# Patient Record
Sex: Male | Born: 1940 | Race: White | Hispanic: No | Smoking: Former smoker
Health system: Southern US, Community
[De-identification: ages and names within clinical notes are randomized; demographics above are authoritative.]

## PROBLEM LIST (undated history)

## (undated) DIAGNOSIS — C801 Malignant (primary) neoplasm, unspecified: Secondary | ICD-10-CM

---

## 2004-10-12 ENCOUNTER — Ambulatory Visit: Payer: Self-pay | Admitting: Internal Medicine

## 2005-01-17 ENCOUNTER — Ambulatory Visit: Payer: Self-pay | Admitting: Internal Medicine

## 2005-02-05 ENCOUNTER — Ambulatory Visit: Payer: Self-pay | Admitting: Gastroenterology

## 2005-02-10 ENCOUNTER — Observation Stay (HOSPITAL_COMMUNITY): Admission: EM | Admit: 2005-02-10 | Discharge: 2005-02-11 | Payer: Self-pay | Admitting: Emergency Medicine

## 2005-03-05 ENCOUNTER — Ambulatory Visit: Payer: Self-pay | Admitting: Gastroenterology

## 2005-03-13 ENCOUNTER — Ambulatory Visit: Payer: Self-pay | Admitting: Gastroenterology

## 2006-07-02 ENCOUNTER — Ambulatory Visit: Payer: Self-pay | Admitting: Internal Medicine

## 2007-01-19 ENCOUNTER — Encounter: Payer: Self-pay | Admitting: *Deleted

## 2008-08-15 ENCOUNTER — Emergency Department (HOSPITAL_COMMUNITY): Admission: EM | Admit: 2008-08-15 | Discharge: 2008-08-15 | Payer: Self-pay | Admitting: Emergency Medicine

## 2008-08-19 ENCOUNTER — Ambulatory Visit (HOSPITAL_COMMUNITY): Admission: RE | Admit: 2008-08-19 | Discharge: 2008-08-19 | Payer: Self-pay | Admitting: General Surgery

## 2010-09-13 LAB — BASIC METABOLIC PANEL
BUN: 9 mg/dL (ref 6–23)
Chloride: 99 mEq/L (ref 96–112)
GFR calc Af Amer: >60 mL/min (ref >60)
GFR calc non Af Amer: >60 mL/min (ref >60)
Glucose, Bld: 94 mg/dL (ref 70–99)
Potassium: 5 mEq/L (ref 3.5–5.1)

## 2010-09-13 LAB — CBC
HCT: 46.6 % (ref 39.0–52.0)
MCHC: 34.9 g/dL (ref 30.0–36.0)
RBC: 4.71 MIL/uL (ref 4.22–5.81)
RDW: 13.7 % (ref 11.5–15.5)
WBC: 5.3 10*3/uL (ref 4.0–10.5)

## 2010-10-16 NOTE — Op Note (Signed)
NAMEMarland Kitchen  Shane Campos, Shane Campos NO.:  [id number]   MEDICAL RECORD NO.:  [id number]          PATIENT TYPE:  AMB   LOCATION:  DAY                           FACILITY:  [facility]   PHYSICIAN:  Tilford Pillar, MD      DATE OF BIRTH:  [birth year]   DATE OF PROCEDURE:  08/19/2008  DATE OF DISCHARGE:                               OPERATIVE REPORT   PREOPERATIVE DIAGNOSIS:  Right inguinal hernia.   POSTOPERATIVE DIAGNOSIS:  Right inguinal hernia.   PROCEDURE:  Right inguinal herniorrhaphy with Proceed mesh.   SURGEON:  Tilford Pillar, MD   ANESTHESIA:  General endotracheal, local anesthetic with 5% Sensorcaine  plain.   SPECIMEN:  None.   ESTIMATED BLOOD LOSS:  Minimal.   INDICATIONS:  The patient is a 70 year old male, who presented to my  office with notable bulge in the right groin.  On evaluation, this was  evidenced to be a right inguinal hernia.  This was easily reducible.  The risk, benefits, and alternatives of herniorrhaphy were discussed at  length with the patient including but not limited to the risk of  bleeding, infection, paresthesias, chronic pain, ischemic orchitis,  testicular loss as well as the possibility of intraoperative cardiac and  pulmonary events.  The patient's questions and concerns were addressed  and the patient was consented for planned procedure.   OPERATION:  The patient was taken to the operating room, was placed in  supine position on the operating table, at which time general anesthetic  was administered.  Once the patient was asleep, he was endotracheally  intubated by Anesthesia.  At this point, the patient's abdomen was  prepped with DuraPrep solution and draped in standard fashion.  A  marking pen was utilized to mark planned line of incision and scalpel  was utilized to create the initial incision.  Dissection down through  subcu tissue including Scarpa fascia was carried out using  electrocautery.  This was carried out down to the  external oblique  fascia.  This was initially scored with a scalpel and then opened  towards the external inguinal ring with Metzenbaum scissors.  With this  opened the hernia and cord structures were freed from the inguinal canal  walls with blunt finger dissection.  A window was created underneath the  cord structures at the pubic tubercle with a blunt digital dissection.  A Penrose drain was then utilized to help to elevate the  cord  structures and hernia sac up to the field.  At this time, I carefully  dissected the indirect component of the hernia, freed from the cord  structures.  With this free, I twisted it upon itself and  performed the  high ligation with a 2-0 Vicryl suture.  The redundant sac was trimmed  with electrocautery and was discarded.  Sac was allowed to return back  to the peritoneal cavity.  Mesh plug was then placed into the defect and  secured with 2-0 Novofil sutures x2 to prevent the migration of the  plug.  Additionally, the patient did have a direct component of this  hernia.  This was  reduced and additional mesh plug was created out of  Prolene mesh with this in place.  This also was fixed with a 2-0 Novofil  suture and then the onlay was placed.  This was packed medially over the  pubic tubercle inferiorly to the shelving portion of the inguinal  ligament superiorly to the conjoint tendon.  A keyhole defect was  secured around the cord structures, then entered into the internal  inguinal ring and tails were tucked underneath the external oblique  fascia.  I was quite pleased with the appearance of the repair.  I then  irrigated the field and closed the fascial layers with a 2-0 Vicryl in a  running continuous fashion.  Local anesthetic was instilled.  A 3-0  Vicryl suture was utilized to reapproximate the  Scarpa fascia.  With  this reapproximated, the 4-0 Monocryl was utilized to reapproximate the  skin edges with running subcuticular suture.  The skin was  washed and  dried with moist dry towel.  Benzoin was applied around the incision.  Half-inch Steri-Strips placed.  Drapes were removed.  The patient was  allowed to come out of the general anesthetic and was transferred back  to regular hospital bed.  He was transferred to Post-Anesthetic Care  Unit in stable condition.  At the conclusion of procedure, all  instrument, sponge, and needle counts were correct.  The patient  tolerated the procedure well.      Tilford Pillar, MD  Electronically Signed     BZ/MEDQ  D:  08/19/2008  T:  08/20/2008  Job:  [id number]

## 2010-10-19 NOTE — Op Note (Signed)
NAMEMarland Kitchen  Shane Campos, Shane Campos NO.:  [id number]   MEDICAL RECORD NO.:  [id number]          PATIENT TYPE:  INP   LOCATION:  1828                         FACILITY:  [facility]   PHYSICIAN:  Beulah Gandy. Ashley Royalty, M.D. DATE OF BIRTH:  [birth year]   DATE OF PROCEDURE:  02/10/2005  DATE OF DISCHARGE:                                 OPERATIVE REPORT   ADMISSION DIAGNOSIS:  Retained lens material in the vitreous, left eye,  glaucoma, left eye, corneal edema, left eye.   PROCEDURES:  Pars plana vitrectomy left eye, anterior chamber washout, left  eye.  Posterior capsulectomy, left eye.  Retinal photocoagulation, left eye.   SURGEON:  Alan Mulder M.D.   ASSISTANT:  Bryan Lemma. Lundquist, P.A.   ANESTHESIA:  General.   DETAILS:  Usual prep and drape, inspection of previous wound showed some  gaping.  Therefore a 10-0 X suture was placed in the wound. No leakage was  seen before or after the suture was placed.  A 5 mm infusion port was placed  at 4 o'clock.  The lighted pick and a cutter were placed at 10 and 2 o'clock  respectively. The sterile glycerin was placed on the cornea and Provisc was  placed on the cornea. A straight shot of epinephrine was injected into the  into the anterior chamber to dilate the pupil the pars plana vitrectomy was  begun behind the pseudophakos.  The view was very dim and difficult. As the  vitrectomy proceeded posteriorly, large white fluffy material was  encountered along with clots of blood in the vitreous cavity. These were  carefully removed under low suction and rapid cutting. The vitrectomy was  carried down to the macular surface where some blood was vacuumed from the  macular surface. The vitrectomy is carried out to the far periphery where  scleral depression was used to gain access to the peripheral vitreous base.  This area was trimmed with the vitreous cutter and all lens remnants were  removed.  The Biom was moved out of place and attention was  carried to the 4  o'clock posterior chamber area. Scleral depression was used to push lens  remnants toward the center of the eye and toward the vitreous cutter where  the remnants were removed. The 12 o'clock hour area was inspected and  additional lens remnants were seen in this area.  Again scleral depression  was used to push these remnants toward the vitreous cutter and toward the  center of the eye. The remnants jumped away from the edges of the implant  and into the vitreous cutter.  The vitreous cavity was rinsed again.  The  endolaser was positioned in the eye and approximately 250 burns were placed  around the far periphery with a power of 1000 milliwatts, 1,000 microns each  and 0.1 seconds each. Anterior chamber washout was performed with a 27 gauge  long needle and an empty 3 mL syringe.  The fluid was withdrawn as the  needle was passed along the inferior anterior chamber angle.  All lens  material was removed from the anterior chamber.  The instruments  were  removed from the eye and 9-0 nylon was used to close the sclerotomy sites.  They were tested and found to be tight. The conjunctiva was closed at 12  o'clock with 7-0 chromic and at 4 o'clock with a wet-field cautery.  The  indirect ophthalmoscope laser was moved into place. Additional burns were  placed with 500 milliwatts power for a total of 769 burns.  Polymyxin and  gentamicin were irrigated into Tenon's space, Decadron 10 milligrams was  injected into the lower subconjunctival space. Marcaine was injected around  the globe for postop pain. Tobradex ophthalmic ointment was placed. Closing  pressure was 10 mm Barraquer tonometer.  All of the wounds were found to be  tight.  A patch and shield were placed. The patient is awakened and taken to  recovery in satisfactory condition.   COMPLICATIONS:  None.   DURATION:  One and a half hours.      Beulah Gandy. Ashley Royalty, M.D.  Electronically Signed     JDM/MEDQ  D:   02/10/2005  T:  02/11/2005  Job:  [id number]

## 2015-02-21 ENCOUNTER — Encounter (HOSPITAL_COMMUNITY): Payer: Self-pay | Admitting: Emergency Medicine

## 2015-02-21 ENCOUNTER — Emergency Department (HOSPITAL_COMMUNITY): Payer: Commercial Managed Care - HMO

## 2015-02-21 ENCOUNTER — Inpatient Hospital Stay (HOSPITAL_COMMUNITY)
Admission: EM | Admit: 2015-02-21 | Discharge: 2015-02-28 | DRG: 166 | Disposition: A | Discharge status: Home / Self Care | Payer: Commercial Managed Care - HMO | Attending: Internal Medicine | Admitting: Internal Medicine

## 2015-02-21 DIAGNOSIS — E440 Moderate protein-calorie malnutrition: Secondary | ICD-10-CM | POA: Diagnosis present

## 2015-02-21 DIAGNOSIS — C4490 Unspecified malignant neoplasm of skin, unspecified: Secondary | ICD-10-CM | POA: Diagnosis present

## 2015-02-21 DIAGNOSIS — Z85828 Personal history of other malignant neoplasm of skin: Secondary | ICD-10-CM

## 2015-02-21 DIAGNOSIS — E871 Hypo-osmolality and hyponatremia: Secondary | ICD-10-CM | POA: Diagnosis present

## 2015-02-21 DIAGNOSIS — K5900 Constipation, unspecified: Secondary | ICD-10-CM | POA: Diagnosis not present

## 2015-02-21 DIAGNOSIS — Z6820 Body mass index (BMI) 20.0-20.9, adult: Secondary | ICD-10-CM

## 2015-02-21 DIAGNOSIS — J15211 Pneumonia due to Methicillin susceptible Staphylococcus aureus: Secondary | ICD-10-CM | POA: Diagnosis present

## 2015-02-21 DIAGNOSIS — R0600 Dyspnea, unspecified: Secondary | ICD-10-CM

## 2015-02-21 DIAGNOSIS — J189 Pneumonia, unspecified organism: Secondary | ICD-10-CM | POA: Diagnosis present

## 2015-02-21 DIAGNOSIS — R042 Hemoptysis: Secondary | ICD-10-CM | POA: Diagnosis present

## 2015-02-21 DIAGNOSIS — D649 Anemia, unspecified: Secondary | ICD-10-CM | POA: Diagnosis present

## 2015-02-21 DIAGNOSIS — C3490 Malignant neoplasm of unspecified part of unspecified bronchus or lung: Secondary | ICD-10-CM | POA: Diagnosis present

## 2015-02-21 DIAGNOSIS — C4499 Other specified malignant neoplasm of skin, unspecified: Secondary | ICD-10-CM | POA: Diagnosis present

## 2015-02-21 DIAGNOSIS — R066 Hiccough: Secondary | ICD-10-CM | POA: Diagnosis present

## 2015-02-21 DIAGNOSIS — R64 Cachexia: Secondary | ICD-10-CM | POA: Diagnosis present

## 2015-02-21 DIAGNOSIS — C3412 Malignant neoplasm of upper lobe, left bronchus or lung: Secondary | ICD-10-CM | POA: Diagnosis not present

## 2015-02-21 DIAGNOSIS — J181 Lobar pneumonia, unspecified organism: Secondary | ICD-10-CM

## 2015-02-21 DIAGNOSIS — R0602 Shortness of breath: Secondary | ICD-10-CM | POA: Diagnosis not present

## 2015-02-21 DIAGNOSIS — J449 Chronic obstructive pulmonary disease, unspecified: Secondary | ICD-10-CM | POA: Diagnosis present

## 2015-02-21 DIAGNOSIS — D638 Anemia in other chronic diseases classified elsewhere: Secondary | ICD-10-CM | POA: Diagnosis present

## 2015-02-21 DIAGNOSIS — J9601 Acute respiratory failure with hypoxia: Secondary | ICD-10-CM | POA: Diagnosis present

## 2015-02-21 DIAGNOSIS — E222 Syndrome of inappropriate secretion of antidiuretic hormone: Secondary | ICD-10-CM | POA: Diagnosis present

## 2015-02-21 DIAGNOSIS — F17200 Nicotine dependence, unspecified, uncomplicated: Secondary | ICD-10-CM | POA: Diagnosis present

## 2015-02-21 DIAGNOSIS — J984 Other disorders of lung: Secondary | ICD-10-CM | POA: Diagnosis present

## 2015-02-21 DIAGNOSIS — Z87891 Personal history of nicotine dependence: Secondary | ICD-10-CM

## 2015-02-21 DIAGNOSIS — I4891 Unspecified atrial fibrillation: Secondary | ICD-10-CM | POA: Diagnosis present

## 2015-02-21 HISTORY — DX: Malignant (primary) neoplasm, unspecified: C80.1

## 2015-02-21 LAB — BASIC METABOLIC PANEL
Anion gap: 9 (ref 5–15)
BUN: 15 mg/dL (ref 6–20)
CALCIUM: 8.3 mg/dL — AB (ref 8.9–10.3)
CO2: 25 mmol/L (ref 22–32)
CREATININE: 0.95 mg/dL (ref 0.61–1.24)
Chloride: 90 mmol/L — ABNORMAL LOW (ref 101–111)
GFR calc non Af Amer: >60 mL/min (ref >60)
Glucose, Bld: 120 mg/dL — ABNORMAL HIGH (ref 65–99)
Potassium: 3.9 mmol/L (ref 3.5–5.1)
SODIUM: 124 mmol/L — AB (ref 135–145)

## 2015-02-21 LAB — I-STAT TROPONIN, ED: TROPONIN I, POC: 0.02 ng/mL (ref 0.00–0.08)

## 2015-02-21 LAB — CBC
HCT: 31.2 % — ABNORMAL LOW (ref 39.0–52.0)
Hemoglobin: 10.5 g/dL — ABNORMAL LOW (ref 13.0–17.0)
MCH: 30.3 pg (ref 26.0–34.0)
MCHC: 33.7 g/dL (ref 30.0–36.0)
MCV: 89.9 fL (ref 78.0–100.0)
PLATELETS: 312 10*3/uL (ref 150–400)
RBC: 3.47 MIL/uL — AB (ref 4.22–5.81)
RDW: 13.7 % (ref 11.5–15.5)
WBC: 8.5 10*3/uL (ref 4.0–10.5)

## 2015-02-21 MED ORDER — IOHEXOL 350 MG/ML SOLN
100.0000 mL | Freq: Once | INTRAVENOUS | Status: AC | PRN
  Administered 2015-02-21: 70 mL via INTRAVENOUS

## 2015-02-21 NOTE — ED Provider Notes (Signed)
CSN: [id number]     Arrival date & time 02/21/15  2156 History   First MD Initiated Contact with Patient 02/21/15 2219     Chief Complaint  Patient presents with  . Shortness of Breath     (Consider location/radiation/quality/duration/timing/severity/associated sxs/prior Treatment) Patient is a 74 y.o. male presenting with shortness of breath.  Shortness of Breath Severity:  Moderate Onset quality:  Gradual Duration:  2 weeks Timing:  Constant Progression:  Worsening Chronicity:  New Context comment:  Spontaneous Relieved by:  Nothing Worsened by:  Nothing tried Associated symptoms: cough (with hiccups)   Associated symptoms: no abdominal pain, no fever and no vomiting     History reviewed. No pertinent past medical history. History reviewed. No pertinent past surgical history. No family history on file. Social History  Substance Use Topics  . Smoking status: Former Research scientist (life sciences)  . Smokeless tobacco: None  . Alcohol Use: No    Review of Systems  Constitutional: Negative for fever.  Respiratory: Positive for cough (with hiccups) and shortness of breath.   Gastrointestinal: Negative for vomiting and abdominal pain.  All other systems reviewed and are negative.     Allergies  Review of patient's allergies indicates no known allergies.  Home Medications   Prior to Admission medications   Medication Sig Start Date End Date Taking? Authorizing Provider  Pseudoephedrine-Ibuprofen (ADVIL COLD & SINUS LIQUI-GELS) 30-200 MG CAPS Take 1 tablet by mouth daily as needed.   Yes Historical Provider, MD   BP 101/59 mmHg  Pulse 94  Temp(Src) 98.3 F (36.8 C) (Oral)  Resp 23  Ht 6' (1.829 m)  Wt 147 lb 11.3 oz (67 kg)  BMI 20.03 kg/m2  SpO2 95% Physical Exam  Constitutional: He is oriented to person, place, and time. He appears well-developed and well-nourished.  HENT:  Head: Normocephalic and atraumatic.  Eyes: Conjunctivae and EOM are normal.  Neck: Normal range of  motion. Neck supple.  Cardiovascular: Normal heart sounds.  An irregularly irregular rhythm present. Tachycardia present.   Pulmonary/Chest: Effort normal and breath sounds normal. No respiratory distress.  Abdominal: He exhibits no distension. There is no tenderness. There is no rebound and no guarding.  Musculoskeletal: Normal range of motion.  Neurological: He is alert and oriented to person, place, and time.  Skin: Skin is warm and dry.  Vitals reviewed.   ED Course  Procedures (including critical care time) Labs Review Labs Reviewed  BASIC METABOLIC PANEL - Abnormal; Notable for the following:    Sodium 124 (*)    Chloride 90 (*)    Glucose, Bld 120 (*)    Calcium 8.3 (*)    All other components within normal limits  CBC - Abnormal; Notable for the following:    RBC 3.47 (*)    Hemoglobin 10.5 (*)    HCT 31.2 (*)    All other components within normal limits  CBC WITH DIFFERENTIAL/PLATELET - Abnormal; Notable for the following:    RBC 3.33 (*)    Hemoglobin 10.0 (*)    HCT 29.6 (*)    Monocytes Absolute 1.5 (*)    All other components within normal limits  COMPREHENSIVE METABOLIC PANEL - Abnormal; Notable for the following:    Sodium 122 (*)    Chloride 89 (*)    Glucose, Bld 104 (*)    Calcium 7.9 (*)    Total Protein 6.2 (*)    Albumin 1.9 (*)    AST 57 (*)    Alkaline Phosphatase 129 (*)  Total Bilirubin 1.4 (*)    All other components within normal limits  PROTIME-INR - Abnormal; Notable for the following:    Prothrombin Time 17.7 (*)    All other components within normal limits  URINALYSIS, ROUTINE W REFLEX MICROSCOPIC (NOT AT [facility]) - Abnormal; Notable for the following:    Specific Gravity, Urine 1.043 (*)    Urobilinogen, UA 2.0 (*)    All other components within normal limits  SEDIMENTATION RATE - Abnormal; Notable for the following:    Sed Rate 100 (*)    All other components within normal limits  C-REACTIVE PROTEIN - Abnormal; Notable for the  following:    CRP 22.1 (*)    All other components within normal limits  OSMOLALITY - Abnormal; Notable for the following:    Osmolality 260 (*)    All other components within normal limits  CULTURE, EXPECTORATED SPUTUM-ASSESSMENT  MRSA PCR SCREENING  GRAM STAIN  CULTURE, BLOOD (ROUTINE X 2)  CULTURE, BLOOD (ROUTINE X 2)  AFB CULTURE WITH SMEAR  AFB CULTURE WITH SMEAR  CULTURE, RESPIRATORY (NON-EXPECTORATED)  MRSA PCR SCREENING  LACTIC ACID, PLASMA  PROCALCITONIN  APTT  CORTISOL  HIV ANTIBODY (ROUTINE TESTING)  STREP PNEUMONIAE URINARY ANTIGEN  TSH  OSMOLALITY, URINE  ETHANOL  QUANTIFERON TB GOLD ASSAY (BLOOD)  LEGIONELLA ANTIGEN, URINE  L. PNEUMOPHILA SEROGP 1 UR AG  CREATININE, URINE, RANDOM  SODIUM, URINE, RANDOM  OSMOLALITY, URINE  I-STAT TROPOININ, ED  TYPE AND SCREEN  ABO/RH    Imaging Review Ct Angio Chest Pe W/cm &/or Wo Cm  02/22/2015   CLINICAL DATA:  74 year old male with shortness of breath under chest pain and hemoptysis.  EXAM: CT ANGIOGRAPHY CHEST WITH CONTRAST  TECHNIQUE: Multidetector CT imaging of the chest was performed using the standard protocol during bolus administration of intravenous contrast. Multiplanar CT image reconstructions and MIPs were obtained to evaluate the vascular anatomy.  CONTRAST:  3m OMNIPAQUE IOHEXOL 350 MG/ML SOLN  COMPARISON:  Radiograph dated 09/16  FINDINGS: There is a 7.6 x 6.7 cm masslike consolidation with central cavitation in the left infrahilar/ lingula region. The mass extends from the pleural surface into the left hilum. There is encasement and narrowing of the left upper lobe bronchus as well as encasement of the left upper lobe pulmonary artery branch. There is diffuse opacification of the left upper lobe with air bronchograms. There is a small left pleural effusion.  There is a focal area of nodularity with "Tree in bud appearance" in the right upper lobe (series 6, image 43 -45) the remainder of the lungs are clear.  There is no pneumothorax.  The thoracic aorta appears unremarkable. There is no CT evidence of pulmonary artery embolism. Top-normal subcarinal lymph nodes. There is no cardiomegaly or pericardial effusion. The thyroid gland and esophagus appear grossly unremarkable.  There is no axillary adenopathy. The chest wall soft tissues appear unremarkable. The osseous structures appear intact. There is no evidence of bony erosion or acute fracture. Mild degenerative changes of the spine.  Review of the MIP images confirms the above findings.  IMPRESSION: No CT evidence of pulmonary embolism.  Left upper lobe/lingula cavitary mass with extension into the left hilum and mediastinum. Differential includes a cavitary neoplasm or an infectious process such as fungal infection, abscess, or TB. Other etiologies are not excluded. Correlation with sputum cultures and pulmonary consult is advised. Bronchoscopy may provide better evaluation if clinically indicated.  Focal right upper lobe tree-in-bud nodular appearance compatible with an infectious process.  Small left pleural effusion.   Electronically Signed   By: Anner Crete M.D.   On: 02/22/2015 00:29   Dg Chest Port 1 View  02/21/2015   CLINICAL DATA:  Dyspnea and tachycardia tonight  EXAM: PORTABLE CHEST - 1 VIEW  COMPARISON:  02/10/2005  FINDINGS: Lungs are mildly hyperinflated. Heart size is normal. There is dense opacity within the left upper lobe, possibly infectious. Right lung is clear. No pulmonary edema.  IMPRESSION: Significant left upper lobe opacity, possibly an infectious process. Followup PA and lateral chest X-ray is recommended in 3-4 weeks following trial of antibiotic therapy to ensure resolution and exclude underlying malignancy.   Electronically Signed   By: Nolon Nations M.D.   On: 02/21/2015 23:14   I have personally reviewed and evaluated these images and lab results as part of my medical decision-making.   EKG Interpretation   Date/Time:   Tuesday February 21 2015 22:03:05 EDT Ventricular Rate:  155 PR Interval:    QRS Duration: 70 QT Interval:  270 QTC Calculation: 433 R Axis:   97 Text Interpretation:  Atrial fibrillation with rapid ventricular response  Rightward axis Septal infarct , age undetermined Marked ST abnormality,  possible inferior subendocardial injury Abnormal ECG Confirmed by Debby Freiberg [phone]) on 02/21/2015 10:21:46 PM      MDM   Final diagnoses:  Atrial fibrillation with RVR  Cavitary lesion of lung    74 y.o. male without pertinent PMH presents with dyspnea as above x 2 weeks.  No immediate precipitant for visit today.  On arrival vitals and physical exam as above.  Elba Barman demonstrated cavitary lung lesion.  Empirically treated with abx, no risk factors for TB on my history.  Pt given dilt with improvement in hr.  Admitted in stable condition  I have reviewed all laboratory and imaging studies if ordered as above  1. Atrial fibrillation with RVR   2. Cavitary lesion of lung         Debby Freiberg, MD 02/22/15 [phone]

## 2015-02-21 NOTE — ED Notes (Addendum)
Pt. reports progressing SOB with productive cough / hemoptysis and hiccups for 2 weeks , denies fever or chills. HR = 160+/min at triage .

## 2015-02-22 DIAGNOSIS — E440 Moderate protein-calorie malnutrition: Secondary | ICD-10-CM | POA: Diagnosis present

## 2015-02-22 DIAGNOSIS — E871 Hypo-osmolality and hyponatremia: Secondary | ICD-10-CM

## 2015-02-22 DIAGNOSIS — J15211 Pneumonia due to Methicillin susceptible Staphylococcus aureus: Secondary | ICD-10-CM | POA: Diagnosis present

## 2015-02-22 DIAGNOSIS — Z87891 Personal history of nicotine dependence: Secondary | ICD-10-CM | POA: Diagnosis not present

## 2015-02-22 DIAGNOSIS — J449 Chronic obstructive pulmonary disease, unspecified: Secondary | ICD-10-CM | POA: Diagnosis present

## 2015-02-22 DIAGNOSIS — D649 Anemia, unspecified: Secondary | ICD-10-CM

## 2015-02-22 DIAGNOSIS — J984 Other disorders of lung: Secondary | ICD-10-CM | POA: Diagnosis not present

## 2015-02-22 DIAGNOSIS — J189 Pneumonia, unspecified organism: Secondary | ICD-10-CM | POA: Diagnosis not present

## 2015-02-22 DIAGNOSIS — R042 Hemoptysis: Secondary | ICD-10-CM | POA: Diagnosis present

## 2015-02-22 DIAGNOSIS — Z85828 Personal history of other malignant neoplasm of skin: Secondary | ICD-10-CM | POA: Diagnosis not present

## 2015-02-22 DIAGNOSIS — R0602 Shortness of breath: Secondary | ICD-10-CM | POA: Diagnosis present

## 2015-02-22 DIAGNOSIS — C4499 Other specified malignant neoplasm of skin, unspecified: Secondary | ICD-10-CM | POA: Diagnosis not present

## 2015-02-22 DIAGNOSIS — C3412 Malignant neoplasm of upper lobe, left bronchus or lung: Secondary | ICD-10-CM | POA: Diagnosis present

## 2015-02-22 DIAGNOSIS — K5900 Constipation, unspecified: Secondary | ICD-10-CM | POA: Diagnosis not present

## 2015-02-22 DIAGNOSIS — I4891 Unspecified atrial fibrillation: Secondary | ICD-10-CM | POA: Diagnosis not present

## 2015-02-22 DIAGNOSIS — F17200 Nicotine dependence, unspecified, uncomplicated: Secondary | ICD-10-CM | POA: Diagnosis present

## 2015-02-22 DIAGNOSIS — J9601 Acute respiratory failure with hypoxia: Secondary | ICD-10-CM | POA: Diagnosis not present

## 2015-02-22 DIAGNOSIS — Z720 Tobacco use: Secondary | ICD-10-CM | POA: Diagnosis not present

## 2015-02-22 DIAGNOSIS — E222 Syndrome of inappropriate secretion of antidiuretic hormone: Secondary | ICD-10-CM | POA: Diagnosis present

## 2015-02-22 DIAGNOSIS — Z6820 Body mass index (BMI) 20.0-20.9, adult: Secondary | ICD-10-CM | POA: Diagnosis not present

## 2015-02-22 DIAGNOSIS — D638 Anemia in other chronic diseases classified elsewhere: Secondary | ICD-10-CM | POA: Diagnosis not present

## 2015-02-22 DIAGNOSIS — C4490 Unspecified malignant neoplasm of skin, unspecified: Secondary | ICD-10-CM | POA: Diagnosis present

## 2015-02-22 DIAGNOSIS — R64 Cachexia: Secondary | ICD-10-CM | POA: Diagnosis present

## 2015-02-22 DIAGNOSIS — R918 Other nonspecific abnormal finding of lung field: Secondary | ICD-10-CM | POA: Diagnosis not present

## 2015-02-22 LAB — CBC WITH DIFFERENTIAL/PLATELET
Basophils Absolute: 0 10*3/uL (ref 0.0–0.1)
Basophils Relative: 0 %
Eosinophils Absolute: 0 10*3/uL (ref 0.0–0.7)
Eosinophils Relative: 0 %
HEMATOCRIT: 29.6 % — AB (ref 39.0–52.0)
HEMOGLOBIN: 10 g/dL — AB (ref 13.0–17.0)
LYMPHS ABS: 1.7 10*3/uL (ref 0.7–4.0)
LYMPHS PCT: 22 %
MCH: 30 pg (ref 26.0–34.0)
MCHC: 33.8 g/dL (ref 30.0–36.0)
MCV: 88.9 fL (ref 78.0–100.0)
MONOS PCT: 19 %
Monocytes Absolute: 1.5 10*3/uL — ABNORMAL HIGH (ref 0.1–1.0)
NEUTROS ABS: 4.6 10*3/uL (ref 1.7–7.7)
NEUTROS PCT: 59 %
Platelets: 315 10*3/uL (ref 150–400)
RBC: 3.33 MIL/uL — AB (ref 4.22–5.81)
RDW: 13.6 % (ref 11.5–15.5)
WBC: 7.8 10*3/uL (ref 4.0–10.5)

## 2015-02-22 LAB — OSMOLALITY, URINE: OSMOLALITY UR: 430 mosm/kg (ref 390–1090)

## 2015-02-22 LAB — COMPREHENSIVE METABOLIC PANEL
ALK PHOS: 129 U/L — AB (ref 38–126)
ALT: 49 U/L (ref 17–63)
ANION GAP: 10 (ref 5–15)
AST: 57 U/L — ABNORMAL HIGH (ref 15–41)
Albumin: 1.9 g/dL — ABNORMAL LOW (ref 3.5–5.0)
BILIRUBIN TOTAL: 1.4 mg/dL — AB (ref 0.3–1.2)
BUN: 13 mg/dL (ref 6–20)
CALCIUM: 7.9 mg/dL — AB (ref 8.9–10.3)
CO2: 23 mmol/L (ref 22–32)
CREATININE: 0.79 mg/dL (ref 0.61–1.24)
Chloride: 89 mmol/L — ABNORMAL LOW (ref 101–111)
GFR calc non Af Amer: >60 mL/min (ref >60)
GLUCOSE: 104 mg/dL — AB (ref 65–99)
Potassium: 3.7 mmol/L (ref 3.5–5.1)
Sodium: 122 mmol/L — ABNORMAL LOW (ref 135–145)
TOTAL PROTEIN: 6.2 g/dL — AB (ref 6.5–8.1)

## 2015-02-22 LAB — GRAM STAIN

## 2015-02-22 LAB — STREP PNEUMONIAE URINARY ANTIGEN: STREP PNEUMO URINARY ANTIGEN: NEGATIVE

## 2015-02-22 LAB — URINALYSIS, ROUTINE W REFLEX MICROSCOPIC
Bilirubin Urine: NEGATIVE
Glucose, UA: NEGATIVE mg/dL
HGB URINE DIPSTICK: NEGATIVE
Ketones, ur: NEGATIVE mg/dL
LEUKOCYTES UA: NEGATIVE
Nitrite: NEGATIVE
PROTEIN: NEGATIVE mg/dL
SPECIFIC GRAVITY, URINE: 1.043 — AB (ref 1.005–1.030)
UROBILINOGEN UA: 2 mg/dL — AB (ref 0.0–1.0)
pH: 6 (ref 5.0–8.0)

## 2015-02-22 LAB — CORTISOL: CORTISOL PLASMA: 22 ug/dL

## 2015-02-22 LAB — PROCALCITONIN: PROCALCITONIN: 0.2 ng/mL

## 2015-02-22 LAB — PROTIME-INR
INR: 1.44 (ref 0.00–1.49)
PROTHROMBIN TIME: 17.7 s — AB (ref 11.6–15.2)

## 2015-02-22 LAB — EXPECTORATED SPUTUM ASSESSMENT W REFEX TO RESP CULTURE

## 2015-02-22 LAB — OSMOLALITY: OSMOLALITY: 260 mosm/kg — AB (ref 275–300)

## 2015-02-22 LAB — TYPE AND SCREEN
ABO/RH(D): O POS
Antibody Screen: NEGATIVE

## 2015-02-22 LAB — MRSA PCR SCREENING: MRSA by PCR: NEGATIVE

## 2015-02-22 LAB — LACTIC ACID, PLASMA: Lactic Acid, Venous: 1.1 mmol/L (ref 0.5–2.0)

## 2015-02-22 LAB — CREATININE, URINE, RANDOM: CREATININE, URINE: 90.06 mg/dL

## 2015-02-22 LAB — C-REACTIVE PROTEIN: CRP: 22.1 mg/dL — AB (ref <1.0)

## 2015-02-22 LAB — ABO/RH: ABO/RH(D): O POS

## 2015-02-22 LAB — ETHANOL: Alcohol, Ethyl (B): <5 mg/dL (ref <5)

## 2015-02-22 LAB — TSH: TSH: 0.689 u[IU]/mL (ref 0.350–4.500)

## 2015-02-22 LAB — HIV ANTIBODY (ROUTINE TESTING W REFLEX): HIV Screen 4th Generation wRfx: NONREACTIVE

## 2015-02-22 LAB — EXPECTORATED SPUTUM ASSESSMENT W GRAM STAIN, RFLX TO RESP C

## 2015-02-22 LAB — SEDIMENTATION RATE: SED RATE: 100 mm/h — AB (ref 0–16)

## 2015-02-22 LAB — SODIUM, URINE, RANDOM

## 2015-02-22 LAB — APTT: aPTT: 35 seconds (ref 24–37)

## 2015-02-22 MED ORDER — PNEUMOCOCCAL VAC POLYVALENT 25 MCG/0.5ML IJ INJ
0.5000 mL | INJECTION | INTRAMUSCULAR | Status: AC
  Administered 2015-02-23: 0.5 mL via INTRAMUSCULAR
  Filled 2015-02-22: qty 0.5

## 2015-02-22 MED ORDER — DILTIAZEM HCL 100 MG IV SOLR
INTRAVENOUS | Status: AC
  Administered 2015-02-22: 5 mg/h
  Filled 2015-02-22: qty 100

## 2015-02-22 MED ORDER — VANCOMYCIN HCL IN DEXTROSE 750-5 MG/150ML-% IV SOLN
750.0000 mg | Freq: Two times a day (BID) | INTRAVENOUS | Status: DC
  Administered 2015-02-22 – 2015-02-24 (×4): 750 mg via INTRAVENOUS
  Filled 2015-02-22 (×10): qty 150

## 2015-02-22 MED ORDER — DEXTROSE 5 % IV SOLN
5.0000 mg/h | Freq: Once | INTRAVENOUS | Status: AC
  Administered 2015-02-22: 5 mg/h via INTRAVENOUS

## 2015-02-22 MED ORDER — DILTIAZEM HCL 100 MG IV SOLR
5.0000 mg/h | INTRAVENOUS | Status: DC
  Administered 2015-02-22: 5 mg/h via INTRAVENOUS
  Filled 2015-02-22: qty 100

## 2015-02-22 MED ORDER — DEXTROSE 5 % IV SOLN
5.0000 mg/h | INTRAVENOUS | Status: DC
  Filled 2015-02-22: qty 100

## 2015-02-22 MED ORDER — METOPROLOL TARTRATE 25 MG PO TABS
25.0000 mg | ORAL_TABLET | Freq: Two times a day (BID) | ORAL | Status: DC
  Administered 2015-02-22 – 2015-02-28 (×13): 25 mg via ORAL
  Filled 2015-02-22 (×13): qty 1

## 2015-02-22 MED ORDER — DILTIAZEM LOAD VIA INFUSION
10.0000 mg | Freq: Once | INTRAVENOUS | Status: AC
  Administered 2015-02-22: 10 mg via INTRAVENOUS
  Filled 2015-02-22: qty 10

## 2015-02-22 MED ORDER — VANCOMYCIN HCL IN DEXTROSE 1-5 GM/200ML-% IV SOLN
1000.0000 mg | INTRAVENOUS | Status: AC
  Administered 2015-02-22: 1000 mg via INTRAVENOUS
  Filled 2015-02-22: qty 200

## 2015-02-22 MED ORDER — GI COCKTAIL ~~LOC~~: 30.0000 mL | Freq: Three times a day (TID) | ORAL | Status: DC | PRN

## 2015-02-22 MED ORDER — BACLOFEN 10 MG PO TABS
5.0000 mg | ORAL_TABLET | Freq: Three times a day (TID) | ORAL | Status: DC | PRN
  Administered 2015-02-22 – 2015-02-24 (×2): 5 mg via ORAL
  Filled 2015-02-22 (×3): qty 1

## 2015-02-22 MED ORDER — PANTOPRAZOLE SODIUM 40 MG PO TBEC
40.0000 mg | DELAYED_RELEASE_TABLET | Freq: Every day | ORAL | Status: DC
  Administered 2015-02-22 – 2015-02-28 (×7): 40 mg via ORAL
  Filled 2015-02-22 (×7): qty 1

## 2015-02-22 MED ORDER — METOPROLOL TARTRATE 1 MG/ML IV SOLN: 5.0000 mg | Freq: Four times a day (QID) | INTRAVENOUS | Status: DC | PRN

## 2015-02-22 MED ORDER — DEXTROSE 5 % IV SOLN
1.0000 g | Freq: Three times a day (TID) | INTRAVENOUS | Status: DC
  Administered 2015-02-22 – 2015-02-25 (×10): 1 g via INTRAVENOUS
  Filled 2015-02-22 (×14): qty 1

## 2015-02-22 NOTE — Care Management Note (Signed)
Case Management Note  Patient Details  Name: Shane Campos MRN: [id number] Date of Birth: 1941/05/15  Subjective/Objective:     Adm w at  fib               Action/Plan: lives at home   Expected Discharge Date:                  Expected Discharge Plan:     In-House Referral:     Discharge planning Services     Post Acute Care Choice:    Choice offered to:     DME Arranged:    DME Agency:     HH Arranged:    [place] Agency:     Status of Service:     Medicare Important Message Given:    Date Medicare IM Given:    Medicare IM give by:    Date Additional Medicare IM Given:    Additional Medicare Important Message give by:     If discussed at [place] of Stay Meetings, dates discussed:    Additional Comments: ur review done  Lacretia Leigh, RN 02/22/2015, 2:24 PM

## 2015-02-22 NOTE — ED Notes (Signed)
Attempted report 

## 2015-02-22 NOTE — ED Notes (Signed)
Pt's son, Breckon Reeves [phone]

## 2015-02-22 NOTE — ED Notes (Signed)
Diltiazem noted to be infusing at shift change despite documentation stating that infusion was stopped.  Dr Hartford Poli called to confirm continuation of drip, drip reordered and continued.

## 2015-02-22 NOTE — Consult Note (Signed)
Name: Shane Campos MRN: [id number] DOB: [birth year]    ADMISSION DATE:  02/21/2015 CONSULTATION DATE:  9/21  REFERRING MD :  [facility]  CHIEF COMPLAINT:  SOB, F/C/S  BRIEF PATIENT DESCRIPTION: Emaciated wm with months of purulent sputum and hemoptysis  SIGNIFICANT EVENTS    STUDIES:     HISTORY OF PRESENT ILLNESS:  74 yo WM, life long 1 ppd smoker(quit 1 month ago due to sob/cough) history of skin cancer face with removal in past and with presumed reoccurrence left temporal area, who presents to [facility] ED with 3 months of cough, 2 months of purulent sputum, 2 weeks of hemoptysis plus fever , chills and sweats for 3 months. He has resisted family encouragement to seek medical care. CT of chest demonstrates 7.6x 6.7 mass/consolidation left lingular region with infringement of lul bronchus. He also has Afib with RVR along with hyponatremia and 30 lbs weight loss over several months. He is a poor historian and has somewhat of a dull affect(possible mets)PCCCM asked to consult for lul cavitary mass. When asked if he would want treatment for lung cancer the answer was "yes".   PAST MEDICAL HISTORY :   has no past medical history on file.  has no past surgical history on file. Prior to Admission medications   Medication Sig Start Date End Date Taking? Authorizing Provider  Pseudoephedrine-Ibuprofen (ADVIL COLD & SINUS LIQUI-GELS) 30-200 MG CAPS Take 1 tablet by mouth daily as needed.   Yes Historical Provider, MD   No Known Allergies  FAMILY HISTORY:  family history is not on file. SOCIAL HISTORY:  reports that he has quit smoking. He does not have any smokeless tobacco history on file. He reports that he does not drink alcohol or use illicit drugs.  REVIEW OF SYSTEMS:   Constitutional: Negative for fever, chills, weight loss, malaise/fatigue and diaphoresis.  HENT: Negative for hearing loss, ear pain, nosebleeds, congestion, sore throat, neck pain, tinnitus and ear discharge.     Eyes: Negative for blurred vision, double vision, photophobia, pain, discharge and redness.  Respiratory: Negative for cough, hemoptysis, sputum production, shortness of breath, wheezing and stridor.   Cardiovascular: Negative for chest pain, palpitations, orthopnea, claudication, leg swelling and PND.  Gastrointestinal: Negative for heartburn, nausea, vomiting, abdominal pain, diarrhea, constipation, blood in stool and melena.  Genitourinary: Negative for dysuria, urgency, frequency, hematuria and flank pain.  Musculoskeletal: Negative for myalgias, back pain, joint pain and falls.  Skin: Negative for itching and rash.  Neurological: Negative for dizziness, tingling, tremors, sensory change, speech change, focal weakness, seizures, loss of consciousness, weakness and headaches.  Endo/Heme/Allergies: Negative for environmental allergies and polydipsia. Does not bruise/bleed easily.  SUBJECTIVE:   VITAL SIGNS: Temp:  [98.3 F (36.8 C)] 98.3 F (36.8 C) (09/20 2206) Pulse Rate:  [99-155] 99 (09/21 0730) Resp:  [18-32] 18 (09/21 0730) BP: (92-133)/(55-98) 111/65 mmHg (09/21 0730) SpO2:  [90 %-98 %] 93 % (09/21 0730) Weight:  [69.4 kg (153 lb)] 69.4 kg (153 lb) (09/20 2206)  PHYSICAL EXAMINATION: General: Emaciated white male with dull affect Neuro:  Dull affect, mae x 4 and follows commands HEENT:  No jvd/LAN Cardiovascular:  hsir ir a fib Lungs:  Coarse rhonchi left > right Abdomen:  Soft + bs, left inguinal herina Musculoskeletal:  intact Skin:  Warm and dry, left temporal  lesion and left ear lesion noted   Recent Labs Lab 02/21/15 2302 02/22/15 0551  NA 124* 122*  K 3.9 3.7  CL 90* 89*  CO2  25 23  BUN 15 13  CREATININE 0.95 0.79  GLUCOSE 120* 104*    Recent Labs Lab 02/21/15 2302 02/22/15 0551  HGB 10.5* 10.0*  HCT 31.2* 29.6*  WBC 8.5 7.8  PLT 312 315   Ct Angio Chest Pe W/cm &/or Wo Cm  02/22/2015   CLINICAL DATA:  74 year old male with shortness of breath  under chest pain and hemoptysis.  EXAM: CT ANGIOGRAPHY CHEST WITH CONTRAST  TECHNIQUE: Multidetector CT imaging of the chest was performed using the standard protocol during bolus administration of intravenous contrast. Multiplanar CT image reconstructions and MIPs were obtained to evaluate the vascular anatomy.  CONTRAST:  62m OMNIPAQUE IOHEXOL 350 MG/ML SOLN  COMPARISON:  Radiograph dated 09/16  FINDINGS: There is a 7.6 x 6.7 cm masslike consolidation with central cavitation in the left infrahilar/ lingula region. The mass extends from the pleural surface into the left hilum. There is encasement and narrowing of the left upper lobe bronchus as well as encasement of the left upper lobe pulmonary artery branch. There is diffuse opacification of the left upper lobe with air bronchograms. There is a small left pleural effusion.  There is a focal area of nodularity with "Tree in bud appearance" in the right upper lobe (series 6, image 43 -45) the remainder of the lungs are clear. There is no pneumothorax.  The thoracic aorta appears unremarkable. There is no CT evidence of pulmonary artery embolism. Top-normal subcarinal lymph nodes. There is no cardiomegaly or pericardial effusion. The thyroid gland and esophagus appear grossly unremarkable.  There is no axillary adenopathy. The chest wall soft tissues appear unremarkable. The osseous structures appear intact. There is no evidence of bony erosion or acute fracture. Mild degenerative changes of the spine.  Review of the MIP images confirms the above findings.  IMPRESSION: No CT evidence of pulmonary embolism.  Left upper lobe/lingula cavitary mass with extension into the left hilum and mediastinum. Differential includes a cavitary neoplasm or an infectious process such as fungal infection, abscess, or TB. Other etiologies are not excluded. Correlation with sputum cultures and pulmonary consult is advised. Bronchoscopy may provide better evaluation if clinically  indicated.  Focal right upper lobe tree-in-bud nodular appearance compatible with an infectious process.  Small left pleural effusion.   Electronically Signed   By: AAnner CreteM.D.   On: 02/22/2015 00:29   Dg Chest Port 1 View  02/21/2015   CLINICAL DATA:  Dyspnea and tachycardia tonight  EXAM: PORTABLE CHEST - 1 VIEW  COMPARISON:  02/10/2005  FINDINGS: Lungs are mildly hyperinflated. Heart size is normal. There is dense opacity within the left upper lobe, possibly infectious. Right lung is clear. No pulmonary edema.  IMPRESSION: Significant left upper lobe opacity, possibly an infectious process. Followup PA and lateral chest X-ray is recommended in 3-4 weeks following trial of antibiotic therapy to ensure resolution and exclude underlying malignancy.   Electronically Signed   By: ENolon NationsM.D.   On: 02/21/2015 23:14    Discussion: 74yo WM, life long 1 ppd smoker(quit 1 month ago due to sob/cough) history of skin cancer face with removal in past and with presumed reoccurrence left temporal area, [facility] 3 months of cough, 2 months of purulent sputum, 2 weeks of hemoptysis plus fever , chills and sweats for 3 months. He has resisted family encouragement to seek medical care. CT of chest demonstrates 7.6x 6.7 mass/consolidation left lingular region with infringement of lul bronchus. He also has  Afib with RVR along with hyponatremia and 30 lbs weight loss over several months. He is a poor historian and has somewhat of a dull affect(possible mets)PCCCM asked to consult for lul cavitary mass. When asked if he would want treatment for lung cancer the answer was "yes".  ASSESSMENT: Principal Problem:   Cavitary lesion of lung, left, rule out TB(doubt)   CAP (community acquired pneumonia)   Atrial fibrillation with RVR   Hyponatremia   Anemia   Smoker   Recurrent skin cancer   PLAN: Suspect FOB most likely avenue for cultures, washing and tissue dx. FNA would create  track for infection fistula. TB testing.  Consider ct head and PET scan in future.  Agree with Abx till narrow with culture data  Afib with rvr treated with Dilt drip. Consider IVF bolus, suspect he is dry  Consider skin bx with derm consult  Hyponatremia per [facility]  Richardson Landry Minor ACNP Maryanna Shape PCCM Pager [phone] till 3 pm If no answer page [phone] 02/22/2015, 8:17 AM

## 2015-02-22 NOTE — Progress Notes (Signed)
[facility] PROGRESS NOTE   Shane Campos DJM:[id number] DOB: [birth year] DOA: 02/21/2015 PCP: No primary care provider on file.  HPI/Subjective: Continues to have cough, some hemoptysis, last time was last night  Assessment/Plan: Principal Problem:   Atrial fibrillation with RVR Active Problems:   Cavitary lesion of lung   CAP (community acquired pneumonia)   Hyponatremia   Anemia   Smoker   Recurrent skin cancer    Patient seen and examined, and data base reviewed. Patient seen earlier today by my colleague Dr. Posey Pronto, this is a no chart note. Patient came in with cavitary lung lesion associated with weight loss, night sweats and hemoptysis. Differential diagnoses includes malignancy, necrotizing staph pneumonia, fungal versus pulmonary tuberculosis.   Code Status: Full Code Family Communication: Plan discussed with the patient. Disposition Plan: Remains inpatient Diet: Diet NPO time specified Except for: Sips with Meds, Ice Chips  Consultants:  PCCM  Procedures:  None  Antibiotics:  None   Objective: Filed Vitals:   02/22/15 1300  BP: 105/54  Pulse: 97  Temp:   Resp: 27   No intake or output data in the 24 hours ending 02/22/15 1424 Filed Weights   02/21/15 2206  Weight: 69.4 kg (153 lb)    Exam: General: Alert and awake, oriented x3, not in any acute distress. HEENT: anicteric sclera, pupils reactive to light and accommodation, EOMI CVS: S1-S2 clear, no murmur rubs or gallops Chest: clear to auscultation bilaterally, no wheezing, rales or rhonchi Abdomen: soft nontender, nondistended, normal bowel sounds, no organomegaly Extremities: no cyanosis, clubbing or edema noted bilaterally Neuro: Cranial nerves II-XII intact, no focal neurological deficits  Data Reviewed: Basic Metabolic Panel:  Recent Labs Lab 02/21/15 2302 02/22/15 0551  NA 124* 122*  K 3.9 3.7  CL 90* 89*  CO2 25 23  GLUCOSE 120* 104*  BUN 15 13  CREATININE  0.95 0.79  CALCIUM 8.3* 7.9*   Liver Function Tests:  Recent Labs Lab 02/22/15 0551  AST 57*  ALT 49  ALKPHOS 129*  BILITOT 1.4*  PROT 6.2*  ALBUMIN 1.9*   No results for input(s): LIPASE, AMYLASE in the last 168 hours. No results for input(s): AMMONIA in the last 168 hours. CBC:  Recent Labs Lab 02/21/15 2302 02/22/15 0551  WBC 8.5 7.8  NEUTROABS  --  4.6  HGB 10.5* 10.0*  HCT 31.2* 29.6*  MCV 89.9 88.9  PLT 312 315   Cardiac Enzymes: No results for input(s): CKTOTAL, CKMB, CKMBINDEX, TROPONINI in the last 168 hours. BNP (last 3 results) No results for input(s): BNP in the last 8760 hours.  ProBNP (last 3 results) No results for input(s): PROBNP in the last 8760 hours.  CBG: No results for input(s): GLUCAP in the last 168 hours.  Micro Recent Results (from the past 240 hour(s))  Gram stain     Status: None   Collection Time: 02/22/15  7:00 AM  Result Value Ref Range Status   Specimen Description SPUTUM  Final   Special Requests NONE  Final   Gram Stain   Final    MODERATE WBC PRESENT,BOTH PMN AND MONONUCLEAR ABUNDANT GRAM POSITIVE COCCI IN PAIRS FEW GRAM POSITIVE COCCI IN CHAINS FEW GRAM NEGATIVE RODS RARE GRAM POSITIVE RODS    Report Status 02/22/2015 FINAL  Final  Culture, sputum-assessment     Status: None   Collection Time: 02/22/15  7:46 AM  Result Value Ref Range Status   Specimen Description SPUTUM  Final   Special Requests NONE  Final   Sputum evaluation   Final    THIS SPECIMEN IS ACCEPTABLE. RESPIRATORY CULTURE REPORT TO FOLLOW.   Report Status 02/22/2015 FINAL  Final     Studies: Ct Angio Chest Pe W/cm &/or Wo Cm  02/22/2015   CLINICAL DATA:  74 year old male with shortness of breath under chest pain and hemoptysis.  EXAM: CT ANGIOGRAPHY CHEST WITH CONTRAST  TECHNIQUE: Multidetector CT imaging of the chest was performed using the standard protocol during bolus administration of intravenous contrast. Multiplanar CT image reconstructions  and MIPs were obtained to evaluate the vascular anatomy.  CONTRAST:  43m OMNIPAQUE IOHEXOL 350 MG/ML SOLN  COMPARISON:  Radiograph dated 09/16  FINDINGS: There is a 7.6 x 6.7 cm masslike consolidation with central cavitation in the left infrahilar/ lingula region. The mass extends from the pleural surface into the left hilum. There is encasement and narrowing of the left upper lobe bronchus as well as encasement of the left upper lobe pulmonary artery branch. There is diffuse opacification of the left upper lobe with air bronchograms. There is a small left pleural effusion.  There is a focal area of nodularity with "Tree in bud appearance" in the right upper lobe (series 6, image 43 -45) the remainder of the lungs are clear. There is no pneumothorax.  The thoracic aorta appears unremarkable. There is no CT evidence of pulmonary artery embolism. Top-normal subcarinal lymph nodes. There is no cardiomegaly or pericardial effusion. The thyroid gland and esophagus appear grossly unremarkable.  There is no axillary adenopathy. The chest wall soft tissues appear unremarkable. The osseous structures appear intact. There is no evidence of bony erosion or acute fracture. Mild degenerative changes of the spine.  Review of the MIP images confirms the above findings.  IMPRESSION: No CT evidence of pulmonary embolism.  Left upper lobe/lingula cavitary mass with extension into the left hilum and mediastinum. Differential includes a cavitary neoplasm or an infectious process such as fungal infection, abscess, or TB. Other etiologies are not excluded. Correlation with sputum cultures and pulmonary consult is advised. Bronchoscopy may provide better evaluation if clinically indicated.  Focal right upper lobe tree-in-bud nodular appearance compatible with an infectious process.  Small left pleural effusion.   Electronically Signed   By: AAnner CreteM.D.   On: 02/22/2015 00:29   Dg Chest Port 1 View  02/21/2015   CLINICAL  DATA:  Dyspnea and tachycardia tonight  EXAM: PORTABLE CHEST - 1 VIEW  COMPARISON:  02/10/2005  FINDINGS: Lungs are mildly hyperinflated. Heart size is normal. There is dense opacity within the left upper lobe, possibly infectious. Right lung is clear. No pulmonary edema.  IMPRESSION: Significant left upper lobe opacity, possibly an infectious process. Followup PA and lateral chest X-ray is recommended in 3-4 weeks following trial of antibiotic therapy to ensure resolution and exclude underlying malignancy.   Electronically Signed   By: ENolon NationsM.D.   On: 02/21/2015 23:14    Scheduled Meds: . cefTAZidime (FORTAZ)  IV  1 g Intravenous 3 times per day  . metoprolol tartrate  25 mg Oral BID  . pantoprazole  40 mg Oral Daily  . vancomycin  750 mg Intravenous Q12H   Continuous Infusions:      Time spent: 35 minutes    ERedlands Community HospitalA  [facility] Pager [phone] 7PM-7AM, please contact night-coverage at [email or web address], password T[email or web address]9/21/2016, 2:24 PM  LOS: 0 days

## 2015-02-22 NOTE — Progress Notes (Signed)
ANTIBIOTIC CONSULT NOTE - INITIAL  Pharmacy Consult for Vancomycin and Ceftazidime Indication: PNA  No Known Allergies  Patient Measurements: Height: 6' (182.9 cm) Weight: 153 lb (69.4 kg) IBW/kg (Calculated) : 77.6  Vital Signs: Temp: 98.3 F (36.8 C) (09/20 2206) Temp Source: Oral (09/20 2206) BP: 120/80 mmHg (09/20 2330) Pulse Rate: 141 (09/20 2330) Intake/Output from previous day:   Intake/Output from this shift:    Labs:  Recent Labs  02/21/15 2302  WBC 8.5  HGB 10.5*  PLT 312  CREATININE 0.95   Estimated Creatinine Clearance: 66 mL/min (by C-G formula based on Cr of 0.95). No results for input(s): VANCOTROUGH, VANCOPEAK, VANCORANDOM, GENTTROUGH, GENTPEAK, GENTRANDOM, TOBRATROUGH, TOBRAPEAK, TOBRARND, AMIKACINPEAK, AMIKACINTROU, AMIKACIN in the last 72 hours.   Microbiology: No results found for this or any previous visit (from the past 720 hour(s)).  Medical History: History reviewed. No pertinent past medical history.  Medications:  Home med: Advil Cold and Sinus  Assessment: 74 y.o. male presents with SOB, productive cough, and hemoptysis. To begin vancomycin and ceftazidime for PNA. WBC wnl. Afeb.   Goal of Therapy:  Vancomycin trough level 15-20 mcg/ml  Plan:  Vancomycin 1gm IV now then '750mg'$  IV q12h Ceftazidime 1gm IV q8h Will f/u renal function, micro data, and pt's clinical condition Vanc trough prn  Sherlon Handing, PharmD, BCPS Clinical pharmacist, pager [phone] 02/22/2015,1:26 AM

## 2015-02-22 NOTE — H&P (Signed)
[facility] History and Physical  Patient: Shane Campos  MRN: [id number]  DOB: [birth year]  DOS: the patient was seen and examined on 02/22/2015 PCP: No primary care [facility] on file.  Referring physician: Dr. Colin Rhein Chief Complaint: Shortness of breath and cough  HPI: Shane Campos is a 74 y.o. male with Past medical history of smoking. Patient presents with complaints of shortness of breath and cough ongoing for last 3 months progressively worsening. He also had some night sweats as well as diaphoresis. He has low-grade fever throughout the day. This is on ongoing since last one month. Next and he has also significant weight loss of 30 pounds over last X months. He denies any lumps or bumps. Denies any travel outside the [place] traveling in [place]. He denies any exposure to tuberculosis. He denies having any chest pain abdominal pain nausea or vomiting. He has been having some hiccups which is present for last 2 months. He denies any high risk behavior. Denies any alcohol abuse. Mentions that he has been smoking until last month. He smokes one pack a day for 50 years. Family history of prostate cancer in father No family history of lung cancer  The patient is coming from home  At his baseline ambulates without support And is independent for most of his ADL; manages his medication on his own.  Review of Systems: as mentioned in the history of present illness.  A comprehensive review of the other systems is negative.  History reviewed. No pertinent past medical history. History reviewed. No pertinent past surgical history. Social History:  reports that he has quit smoking. He does not have any smokeless tobacco history on file. He reports that he does not drink alcohol or use illicit drugs.  No Known Allergies  No family history on file.  Prior to Admission medications   Medication Sig Start Date End Date Taking? Authorizing Raynetta Osterloh    Pseudoephedrine-Ibuprofen (ADVIL COLD & SINUS LIQUI-GELS) 30-200 MG CAPS Take 1 tablet by mouth daily as needed.   Yes Historical Pranish Akhavan, MD    Physical Exam: Filed Vitals:   02/22/15 0315 02/22/15 0330 02/22/15 0345 02/22/15 0400  BP: 111/74 92/71 110/68 109/62  Pulse: 134 142 124 120  Temp:      TempSrc:      Resp: '28 21 21 '$ 32  Height:      Weight:      SpO2: 96% 95% 96% 95%    General: Alert, Awake and Oriented to Time, Place and Person. Appear in mild distress Eyes: PERRL ENT: Oral Mucosa clear moist. Neck: no JVD Cardiovascular: S1 and S2 Present, no Murmur, Peripheral Pulses Present Respiratory: Bilateral Air entry equal and Decreased,  Bilateral Crackles, no wheezes Abdomen: Bowel Sound present, Soft and no tenderness Skin: no Rash Extremities: no Pedal edema, no calf tenderness Neurologic: Grossly no focal neuro deficit. Labs on Admission:  CBC:  Recent Labs Lab 02/21/15 2302  WBC 8.5  HGB 10.5*  HCT 31.2*  MCV 89.9  PLT 312    CMP     Component Value Date/Time   NA 124* 02/21/2015 2302   K 3.9 02/21/2015 2302   CL 90* 02/21/2015 2302   CO2 25 02/21/2015 2302   GLUCOSE 120* 02/21/2015 2302   BUN 15 02/21/2015 2302   CREATININE 0.95 02/21/2015 2302   CALCIUM 8.3* 02/21/2015 2302   GFRNONAA >60 02/21/2015 2302   GFRAA >60 02/21/2015 2302    No results for input(s): CKTOTAL, CKMB, CKMBINDEX, TROPONINI in  the last 168 hours. BNP (last 3 results) No results for input(s): BNP in the last 8760 hours.  ProBNP (last 3 results) No results for input(s): PROBNP in the last 8760 hours.   Radiological Exams on Admission: Ct Angio Chest Pe W/cm &/or Wo Cm  02/22/2015   CLINICAL DATA:  74 year old male with shortness of breath under chest pain and hemoptysis.  EXAM: CT ANGIOGRAPHY CHEST WITH CONTRAST  TECHNIQUE: Multidetector CT imaging of the chest was performed using the standard protocol during bolus administration of intravenous contrast. Multiplanar  CT image reconstructions and MIPs were obtained to evaluate the vascular anatomy.  CONTRAST:  72m OMNIPAQUE IOHEXOL 350 MG/ML SOLN  COMPARISON:  Radiograph dated 09/16  FINDINGS: There is a 7.6 x 6.7 cm masslike consolidation with central cavitation in the left infrahilar/ lingula region. The mass extends from the pleural surface into the left hilum. There is encasement and narrowing of the left upper lobe bronchus as well as encasement of the left upper lobe pulmonary artery branch. There is diffuse opacification of the left upper lobe with air bronchograms. There is a small left pleural effusion.  There is a focal area of nodularity with "Tree in bud appearance" in the right upper lobe (series 6, image 43 -45) the remainder of the lungs are clear. There is no pneumothorax.  The thoracic aorta appears unremarkable. There is no CT evidence of pulmonary artery embolism. Top-normal subcarinal lymph nodes. There is no cardiomegaly or pericardial effusion. The thyroid gland and esophagus appear grossly unremarkable.  There is no axillary adenopathy. The chest wall soft tissues appear unremarkable. The osseous structures appear intact. There is no evidence of bony erosion or acute fracture. Mild degenerative changes of the spine.  Review of the MIP images confirms the above findings.  IMPRESSION: No CT evidence of pulmonary embolism.  Left upper lobe/lingula cavitary mass with extension into the left hilum and mediastinum. Differential includes a cavitary neoplasm or an infectious process such as fungal infection, abscess, or TB. Other etiologies are not excluded. Correlation with sputum cultures and pulmonary consult is advised. Bronchoscopy may provide better evaluation if clinically indicated.  Focal right upper lobe tree-in-bud nodular appearance compatible with an infectious process.  Small left pleural effusion.   Electronically Signed   By: AAnner CreteM.D.   On: 02/22/2015 00:29   Dg Chest Port 1  View  02/21/2015   CLINICAL DATA:  Dyspnea and tachycardia tonight  EXAM: PORTABLE CHEST - 1 VIEW  COMPARISON:  02/10/2005  FINDINGS: Lungs are mildly hyperinflated. Heart size is normal. There is dense opacity within the left upper lobe, possibly infectious. Right lung is clear. No pulmonary edema.  IMPRESSION: Significant left upper lobe opacity, possibly an infectious process. Followup PA and lateral chest X-ray is recommended in 3-4 weeks following trial of antibiotic therapy to ensure resolution and exclude underlying malignancy.   Electronically Signed   By: ENolon NationsM.D.   On: 02/21/2015 23:14   EKG: Independently reviewed. atrial fibrillation, rate RVR.  Assessment/Plan 1. Atrial fibrillation with RVR Patient presents with complains of progressively worsening shortness of breath and cough. EKG shows A. fib with RVR here. Patient is currently on Cardizem drip. We'll continue to closely monitor him. Patient recently admitted in stepdown unit.  2.Cavitary lesion of lung   CAP (community acquired pneumonia) Patient is presenting with numbness of cough and shortness of breath. He also has occasional hemoptysis. Has lost weight also has noted fever. And night sweats. With this  patient will be admitted in stepdown unit. Pulmonary consulted as a CT scan is showing a cavitary lesion. Possibility of TB is less likely but would check quantiferon tb gold as well as AFB smear. Also patient will be treated broadly with vancomycin and Tressie Ellis to cover him for MRSA as well as Pseudomonas. With presence of hyponatremia this is more likely cancer that is also possible small cell cancer.    Hyponatremia Etiology currently unclear we will recheck the labs. If persistent hyponatremia is present and then would require further workup. Probably SIADH associated with small cell cancer.    Anemia Chronic anemia appears. No active bleeding continue close monitoring.  Nutrition: Nothing by mouth  except medications DVT Prophylaxis: mechanical compression device  Advance goals of care discussion: Full code   Consults: Pulmonary Disposition: Admitted as inpatient in stepdown unit.  Author: Berle Mull, MD [facility] Pager: [phone] 02/22/2015  If 7PM-7AM, please contact night-coverage [email or web address] Password [email or web address]

## 2015-02-23 ENCOUNTER — Encounter (HOSPITAL_COMMUNITY): Payer: Self-pay | Admitting: *Deleted

## 2015-02-23 DIAGNOSIS — J189 Pneumonia, unspecified organism: Secondary | ICD-10-CM

## 2015-02-23 DIAGNOSIS — C3412 Malignant neoplasm of upper lobe, left bronchus or lung: Secondary | ICD-10-CM | POA: Diagnosis not present

## 2015-02-23 DIAGNOSIS — J984 Other disorders of lung: Secondary | ICD-10-CM

## 2015-02-23 DIAGNOSIS — R066 Hiccough: Secondary | ICD-10-CM

## 2015-02-23 DIAGNOSIS — Z720 Tobacco use: Secondary | ICD-10-CM

## 2015-02-23 LAB — LEGIONELLA ANTIGEN, URINE

## 2015-02-23 LAB — OSMOLALITY, URINE: OSMOLALITY UR: 456 mosm/kg (ref 390–1090)

## 2015-02-23 LAB — LEGIONELLA PNEUMOPHILA SEROGP 1 UR AG: L. pneumophila Serogp 1 Ur Ag: NEGATIVE

## 2015-02-23 MED ORDER — SODIUM CHLORIDE 0.9 % IV SOLN
INTRAVENOUS | Status: DC
  Administered 2015-02-23: 08:00:00 via INTRAVENOUS

## 2015-02-23 MED ORDER — DILTIAZEM HCL 30 MG PO TABS
30.0000 mg | ORAL_TABLET | Freq: Four times a day (QID) | ORAL | Status: DC
  Administered 2015-02-23 – 2015-02-24 (×5): 30 mg via ORAL
  Filled 2015-02-23 (×5): qty 1

## 2015-02-23 MED ORDER — CHLORPROMAZINE HCL 10 MG PO TABS
10.0000 mg | ORAL_TABLET | Freq: Three times a day (TID) | ORAL | Status: DC | PRN
  Administered 2015-02-23 – 2015-02-24 (×3): 10 mg via ORAL
  Filled 2015-02-23 (×7): qty 1

## 2015-02-23 NOTE — Progress Notes (Addendum)
Name: Shane Campos MRN: [id number] DOB: 09-18-1940    ADMISSION DATE:  02/21/2015 CONSULTATION DATE:  9/21  REFERRING MD :  [facility]  CHIEF COMPLAINT:  SOB, F/C/S  BRIEF PATIENT DESCRIPTION: Emaciated wm with months of purulent sputum and hemoptysis  SIGNIFICANT EVENTS    STUDIES:     HISTORY OF PRESENT ILLNESS:  74 yo WM, life long 1 ppd smoker(quit 1 month ago due to sob/cough) history of skin cancer face with removal in past and with presumed reoccurrence left temporal area, who presents to [facility] ED with 3 months of cough, 2 months of purulent sputum, 2 weeks of hemoptysis plus fever , chills and sweats for 3 months. He has resisted family encouragement to seek medical care. CT of chest demonstrates 7.6x 6.7 mass/consolidation left lingular region with infringement of lul bronchus. He also has Afib with RVR along with hyponatremia and 30 lbs weight loss over several months. He is a poor historian and has somewhat of a dull affect(possible mets)PCCCM asked to consult for lul cavitary mass. When asked if he would want treatment for lung cancer the answer was "yes".  SUBJECTIVE: No events overnight.  VITAL SIGNS: Temp:  [97 F (36.1 C)-99.2 F (37.3 C)] 97 F (36.1 C) (09/22 1129) Pulse Rate:  [57-109] 90 (09/22 0752) Resp:  [16-32] 24 (09/22 1129) BP: (93-136)/(40-98) 98/66 mmHg (09/22 1129) SpO2:  [92 %-99 %] 99 % (09/22 1129) Weight:  [67 kg (147 lb 11.3 oz)-67.359 kg (148 lb 8 oz)] 67.359 kg (148 lb 8 oz) (09/22 0500)  PHYSICAL EXAMINATION: General: Emaciated white male with dull affect Neuro:  Dull affect, mae x 4 and follows commands HEENT:  No jvd/LAN Cardiovascular:  hsir ir a fib Lungs:  Coarse rhonchi left > right Abdomen:  Soft + bs, left inguinal herina Musculoskeletal:  intact Skin:  Warm and dry, left temporal  lesion and left ear lesion noted   Recent Labs Lab 02/21/15 2302 02/22/15 0551  NA 124* 122*  K 3.9 3.7  CL 90* 89*  CO2 25 23  BUN 15  13  CREATININE 0.95 0.79  GLUCOSE 120* 104*    Recent Labs Lab 02/21/15 2302 02/22/15 0551  HGB 10.5* 10.0*  HCT 31.2* 29.6*  WBC 8.5 7.8  PLT 312 315   Ct Angio Chest Pe W/cm &/or Wo Cm  02/22/2015   CLINICAL DATA:  74 year old male with shortness of breath under chest pain and hemoptysis.  EXAM: CT ANGIOGRAPHY CHEST WITH CONTRAST  TECHNIQUE: Multidetector CT imaging of the chest was performed using the standard protocol during bolus administration of intravenous contrast. Multiplanar CT image reconstructions and MIPs were obtained to evaluate the vascular anatomy.  CONTRAST:  15m OMNIPAQUE IOHEXOL 350 MG/ML SOLN  COMPARISON:  Radiograph dated 09/16  FINDINGS: There is a 7.6 x 6.7 cm masslike consolidation with central cavitation in the left infrahilar/ lingula region. The mass extends from the pleural surface into the left hilum. There is encasement and narrowing of the left upper lobe bronchus as well as encasement of the left upper lobe pulmonary artery branch. There is diffuse opacification of the left upper lobe with air bronchograms. There is a small left pleural effusion.  There is a focal area of nodularity with "Tree in bud appearance" in the right upper lobe (series 6, image 43 -45) the remainder of the lungs are clear. There is no pneumothorax.  The thoracic aorta appears unremarkable. There is no CT evidence of pulmonary artery embolism. Top-normal subcarinal lymph  nodes. There is no cardiomegaly or pericardial effusion. The thyroid gland and esophagus appear grossly unremarkable.  There is no axillary adenopathy. The chest wall soft tissues appear unremarkable. The osseous structures appear intact. There is no evidence of bony erosion or acute fracture. Mild degenerative changes of the spine.  Review of the MIP images confirms the above findings.  IMPRESSION: No CT evidence of pulmonary embolism.  Left upper lobe/lingula cavitary mass with extension into the left hilum and mediastinum.  Differential includes a cavitary neoplasm or an infectious process such as fungal infection, abscess, or TB. Other etiologies are not excluded. Correlation with sputum cultures and pulmonary consult is advised. Bronchoscopy may provide better evaluation if clinically indicated.  Focal right upper lobe tree-in-bud nodular appearance compatible with an infectious process.  Small left pleural effusion.   Electronically Signed   By: Anner Crete M.D.   On: 02/22/2015 00:29   Dg Chest Port 1 View  02/21/2015   CLINICAL DATA:  Dyspnea and tachycardia tonight  EXAM: PORTABLE CHEST - 1 VIEW  COMPARISON:  02/10/2005  FINDINGS: Lungs are mildly hyperinflated. Heart size is normal. There is dense opacity within the left upper lobe, possibly infectious. Right lung is clear. No pulmonary edema.  IMPRESSION: Significant left upper lobe opacity, possibly an infectious process. Followup PA and lateral chest X-ray is recommended in 3-4 weeks following trial of antibiotic therapy to ensure resolution and exclude underlying malignancy.   Electronically Signed   By: Nolon Nations M.D.   On: 02/21/2015 23:14   I reviewed chest CT myself, posterior segment of LUL cavitary lesion noted.  Discussion: 74 yo WM, life long 1 ppd smoker(quit 1 month ago due to sob/cough) history of skin cancer face with removal in past and with presumed reoccurrence left temporal area, who presents to [facility] ED with 3 months of cough, 2 months of purulent sputum, 2 weeks of hemoptysis plus fever , chills and sweats for 3 months. He has resisted family encouragement to seek medical care. CT of chest demonstrates 7.6x 6.7 mass/consolidation left lingular region with infringement of lul bronchus. He also has Afib with RVR along with hyponatremia and 30 lbs weight loss over several months. He is a poor historian and has somewhat of a dull affect(possible mets)PCCCM asked to consult for lul cavitary mass. When asked if he would want treatment for lung  cancer the answer was "yes".  ASSESSMENT: Principal Problem:   Cavitary lesion of lung, left, rule out TB(doubt)   CAP (community acquired pneumonia)   Atrial fibrillation with RVR   Hyponatremia   Anemia   Smoker   Recurrent skin cancer   PLAN: Plan bronch on 9/23 at 8:30 AM.  Will sent fluid and cytology with bronch.  Consider ct head and PET scan in future.  Agree with Abx till narrow with culture data  Consider skin bx with derm consult as outpatient.  Rush Farmer, M.D. [facility] Pulmonary/Critical Care Medicine. Pager: [phone]. After hours pager: [phone].

## 2015-02-23 NOTE — Progress Notes (Addendum)
[facility] PROGRESS NOTE   Shane Campos CZY:[id number] DOB: [birth year] DOA: 02/21/2015 PCP: No primary care provider on file.  HPI/Subjective: Continues to have cough, some hemoptysis, last time was last night  Assessment/Plan: Principal Problem:   Atrial fibrillation with RVR Active Problems:   Cavitary lesion of lung   CAP (community acquired pneumonia)   Hyponatremia   Anemia   Smoker   Recurrent skin cancer     Cavitary lesion of lung As seen by PCCM, cavitary lung lesion likely malignancy with postobstructive pneumonia. Cannot rule out other ptosis include TB, fungal pneumonia or necrotizing pneumonia. Although TB in the list it's unlikely as patient is not immunocompromised or has any risk factors. Started on vancomycin and Tressie Ellis continue.  Atrial fibrillation with RVR Patient presents with complains of progressively worsening shortness of breath and cough. EKG shows A. fib with RVR here. Was on Cardizem drip, discontinued. Currently on beta blockers or Cardizem. This patients CHA2DS2-VASc Score and unadjusted Ischemic Stroke Rate (% per year) is equal to 2.2 % stroke rate/year from a score of 2 age of 74, no anticoagulation for now till bronchoscopy or biopsy.  Above score calculated as 1 point each if present [CHF, HTN, DM, Vascular=MI/PAD/Aortic Plaque, Age if 65-74, or Male] Above score calculated as 2 points each if present [Age > 75, or Stroke/TIA/TE]  CAP (community acquired pneumonia) CAP versus postobstructive pneumonia secondary to the cavitary lung mass Continue vancomycin and Fortaz  Hyponatremia Cannot rule out SIADH secondary to the cavitary lung mass which is probably malignant. Started IV fluids, probably will need Lasix and fluid restriction.  Anemia Chronic anemia appears. No active bleeding continue close monitoring.  Hiccups Intractable hiccups (singultus), this is likely secondary to the irritation of diaphragm  intervention. The lung lesion is extending into the hilum/mediastinum, on baclofen. We'll try his Thorazine as well, whatch closely to avoid sedation.  Code Status: Full Code Family Communication: Plan discussed with the patient. Disposition Plan: Remains inpatient Diet: Diet Heart Room service appropriate?: Yes; Fluid consistency:: Thin  Consultants:  PCCM  Procedures:  None  Antibiotics:  None   Objective: Filed Vitals:   02/23/15 0752  BP: 98/68  Pulse: 90  Temp: 97.4 F (36.3 C)  Resp: 18    Intake/Output Summary (Last 24 hours) at 02/23/15 1049 Last data filed at 02/23/15 0900  Gross per 24 hour  Intake 1163.25 ml  Output    850 ml  Net 313.25 ml   Filed Weights   02/21/15 2206 02/22/15 1400 02/23/15 0500  Weight: 69.4 kg (153 lb) 67 kg (147 lb 11.3 oz) 67.359 kg (148 lb 8 oz)    Exam: General: Alert and awake, oriented x3, not in any acute distress. HEENT: anicteric sclera, pupils reactive to light and accommodation, EOMI CVS: S1-S2 clear, no murmur rubs or gallops Chest: clear to auscultation bilaterally, no wheezing, rales or rhonchi Abdomen: soft nontender, nondistended, normal bowel sounds, no organomegaly Extremities: no cyanosis, clubbing or edema noted bilaterally Neuro: Cranial nerves II-XII intact, no focal neurological deficits  Data Reviewed: Basic Metabolic Panel:  Recent Labs Lab 02/21/15 2302 02/22/15 0551  NA 124* 122*  K 3.9 3.7  CL 90* 89*  CO2 25 23  GLUCOSE 120* 104*  BUN 15 13  CREATININE 0.95 0.79  CALCIUM 8.3* 7.9*   Liver Function Tests:  Recent Labs Lab 02/22/15 0551  AST 57*  ALT 49  ALKPHOS 129*  BILITOT 1.4*  PROT 6.2*  ALBUMIN 1.9*   No  results for input(s): LIPASE, AMYLASE in the last 168 hours. No results for input(s): AMMONIA in the last 168 hours. CBC:  Recent Labs Lab 02/21/15 2302 02/22/15 0551  WBC 8.5 7.8  NEUTROABS  --  4.6  HGB 10.5* 10.0*  HCT 31.2* 29.6*  MCV 89.9 88.9  PLT 312  315   Cardiac Enzymes: No results for input(s): CKTOTAL, CKMB, CKMBINDEX, TROPONINI in the last 168 hours. BNP (last 3 results) No results for input(s): BNP in the last 8760 hours.  ProBNP (last 3 results) No results for input(s): PROBNP in the last 8760 hours.  CBG: No results for input(s): GLUCAP in the last 168 hours.  Micro Recent Results (from the past 240 hour(s))  Gram stain     Status: None   Collection Time: 02/22/15  7:00 AM  Result Value Ref Range Status   Specimen Description SPUTUM  Final   Special Requests NONE  Final   Gram Stain   Final    MODERATE WBC PRESENT,BOTH PMN AND MONONUCLEAR ABUNDANT GRAM POSITIVE COCCI IN PAIRS FEW GRAM POSITIVE COCCI IN CHAINS FEW GRAM NEGATIVE RODS RARE GRAM POSITIVE RODS    Report Status 02/22/2015 FINAL  Final  Culture, sputum-assessment     Status: None   Collection Time: 02/22/15  7:46 AM  Result Value Ref Range Status   Specimen Description SPUTUM  Final   Special Requests NONE  Final   Sputum evaluation   Final    THIS SPECIMEN IS ACCEPTABLE. RESPIRATORY CULTURE REPORT TO FOLLOW.   Report Status 02/22/2015 FINAL  Final  Culture, respiratory (NON-Expectorated)     Status: None (Preliminary result)   Collection Time: 02/22/15  7:46 AM  Result Value Ref Range Status   Specimen Description SPUTUM  Final   Special Requests NONE  Final   Gram Stain   Final    MODERATE WBC PRESENT,BOTH PMN AND MONONUCLEAR NO SQUAMOUS EPITHELIAL CELLS SEEN ABUNDANT GRAM POSITIVE COCCI IN PAIRS FEW GRAM NEGATIVE RODS RARE GRAM POSITIVE RODS    Culture   Final    Culture reincubated for better growth Performed at [facility]    Report Status PENDING  Incomplete  MRSA PCR Screening     Status: None   Collection Time: 02/22/15  2:30 PM  Result Value Ref Range Status   MRSA by PCR NEGATIVE NEGATIVE Final    Comment:        The GeneXpert MRSA Assay (FDA approved for NASAL specimens only), is one component of a comprehensive  MRSA colonization surveillance program. It is not intended to diagnose MRSA infection nor to guide or monitor treatment for MRSA infections.      Studies: Ct Angio Chest Pe W/cm &/or Wo Cm  02/22/2015   CLINICAL DATA:  74 year old male with shortness of breath under chest pain and hemoptysis.  EXAM: CT ANGIOGRAPHY CHEST WITH CONTRAST  TECHNIQUE: Multidetector CT imaging of the chest was performed using the standard protocol during bolus administration of intravenous contrast. Multiplanar CT image reconstructions and MIPs were obtained to evaluate the vascular anatomy.  CONTRAST:  63m OMNIPAQUE IOHEXOL 350 MG/ML SOLN  COMPARISON:  Radiograph dated 09/16  FINDINGS: There is a 7.6 x 6.7 cm masslike consolidation with central cavitation in the left infrahilar/ lingula region. The mass extends from the pleural surface into the left hilum. There is encasement and narrowing of the left upper lobe bronchus as well as encasement of the left upper lobe pulmonary artery branch. There is diffuse opacification of the  left upper lobe with air bronchograms. There is a small left pleural effusion.  There is a focal area of nodularity with "Tree in bud appearance" in the right upper lobe (series 6, image 43 -45) the remainder of the lungs are clear. There is no pneumothorax.  The thoracic aorta appears unremarkable. There is no CT evidence of pulmonary artery embolism. Top-normal subcarinal lymph nodes. There is no cardiomegaly or pericardial effusion. The thyroid gland and esophagus appear grossly unremarkable.  There is no axillary adenopathy. The chest wall soft tissues appear unremarkable. The osseous structures appear intact. There is no evidence of bony erosion or acute fracture. Mild degenerative changes of the spine.  Review of the MIP images confirms the above findings.  IMPRESSION: No CT evidence of pulmonary embolism.  Left upper lobe/lingula cavitary mass with extension into the left hilum and mediastinum.  Differential includes a cavitary neoplasm or an infectious process such as fungal infection, abscess, or TB. Other etiologies are not excluded. Correlation with sputum cultures and pulmonary consult is advised. Bronchoscopy may provide better evaluation if clinically indicated.  Focal right upper lobe tree-in-bud nodular appearance compatible with an infectious process.  Small left pleural effusion.   Electronically Signed   By: Anner Crete M.D.   On: 02/22/2015 00:29   Dg Chest Port 1 View  02/21/2015   CLINICAL DATA:  Dyspnea and tachycardia tonight  EXAM: PORTABLE CHEST - 1 VIEW  COMPARISON:  02/10/2005  FINDINGS: Lungs are mildly hyperinflated. Heart size is normal. There is dense opacity within the left upper lobe, possibly infectious. Right lung is clear. No pulmonary edema.  IMPRESSION: Significant left upper lobe opacity, possibly an infectious process. Followup PA and lateral chest X-ray is recommended in 3-4 weeks following trial of antibiotic therapy to ensure resolution and exclude underlying malignancy.   Electronically Signed   By: Nolon Nations M.D.   On: 02/21/2015 23:14    Scheduled Meds: . cefTAZidime (FORTAZ)  IV  1 g Intravenous 3 times per day  . diltiazem  30 mg Oral 4 times per day  . metoprolol tartrate  25 mg Oral BID  . pantoprazole  40 mg Oral Daily  . vancomycin  750 mg Intravenous Q12H   Continuous Infusions: . sodium chloride 100 mL/hr at 02/23/15 0800       Time spent: 35 minutes    [facility]  [facility] Pager [phone] If 7PM-7AM, please contact night-coverage at [email or web address], password [facility] 02/23/2015, 10:49 AM  LOS: 1 day

## 2015-02-24 ENCOUNTER — Encounter (HOSPITAL_COMMUNITY)
Admission: EM | Disposition: A | Discharge status: Home / Self Care | Payer: Self-pay | Source: Home / Self Care | Attending: Internal Medicine

## 2015-02-24 ENCOUNTER — Inpatient Hospital Stay (HOSPITAL_COMMUNITY): Payer: Commercial Managed Care - HMO

## 2015-02-24 ENCOUNTER — Encounter (HOSPITAL_COMMUNITY): Payer: Self-pay | Admitting: Internal Medicine

## 2015-02-24 DIAGNOSIS — J9601 Acute respiratory failure with hypoxia: Secondary | ICD-10-CM | POA: Diagnosis present

## 2015-02-24 DIAGNOSIS — C4499 Other specified malignant neoplasm of skin, unspecified: Secondary | ICD-10-CM

## 2015-02-24 DIAGNOSIS — J181 Lobar pneumonia, unspecified organism: Secondary | ICD-10-CM

## 2015-02-24 DIAGNOSIS — J189 Pneumonia, unspecified organism: Secondary | ICD-10-CM | POA: Diagnosis present

## 2015-02-24 DIAGNOSIS — I4891 Unspecified atrial fibrillation: Secondary | ICD-10-CM

## 2015-02-24 DIAGNOSIS — D638 Anemia in other chronic diseases classified elsewhere: Secondary | ICD-10-CM

## 2015-02-24 DIAGNOSIS — J9600 Acute respiratory failure, unspecified whether with hypoxia or hypercapnia: Secondary | ICD-10-CM

## 2015-02-24 DIAGNOSIS — J984 Other disorders of lung: Secondary | ICD-10-CM | POA: Diagnosis present

## 2015-02-24 DIAGNOSIS — E440 Moderate protein-calorie malnutrition: Secondary | ICD-10-CM

## 2015-02-24 HISTORY — PX: VIDEO BRONCHOSCOPY: SHX5072

## 2015-02-24 LAB — RENAL FUNCTION PANEL
ALBUMIN: 1.7 g/dL — AB (ref 3.5–5.0)
ANION GAP: 6 (ref 5–15)
BUN: 12 mg/dL (ref 6–20)
CALCIUM: 8 mg/dL — AB (ref 8.9–10.3)
CO2: 26 mmol/L (ref 22–32)
Chloride: 95 mmol/L — ABNORMAL LOW (ref 101–111)
Creatinine, Ser: 0.81 mg/dL (ref 0.61–1.24)
GFR calc Af Amer: >60 mL/min (ref >60)
GLUCOSE: 102 mg/dL — AB (ref 65–99)
PHOSPHORUS: 2.9 mg/dL (ref 2.5–4.6)
Potassium: 3.6 mmol/L (ref 3.5–5.1)
SODIUM: 127 mmol/L — AB (ref 135–145)

## 2015-02-24 LAB — PROTIME-INR
INR: 1.47 (ref 0.00–1.49)
PROTHROMBIN TIME: 17.9 s — AB (ref 11.6–15.2)

## 2015-02-24 LAB — QUANTIFERON IN TUBE
QFT TB AG MINUS NIL VALUE: 0 IU/mL
QUANTIFERON MITOGEN VALUE: 0.72 IU/mL
QUANTIFERON TB AG VALUE: 0.03 IU/mL
QUANTIFERON TB GOLD: NEGATIVE
Quantiferon Nil Value: 0.03 IU/mL

## 2015-02-24 LAB — VANCOMYCIN, TROUGH: VANCOMYCIN TR: 13 ug/mL (ref 10.0–20.0)

## 2015-02-24 LAB — QUANTIFERON TB GOLD ASSAY (BLOOD)

## 2015-02-24 SURGERY — VIDEO BRONCHOSCOPY WITHOUT FLUORO
Anesthesia: Moderate Sedation | Laterality: Bilateral

## 2015-02-24 MED ORDER — MIDAZOLAM HCL 10 MG/2ML IJ SOLN
INTRAMUSCULAR | Status: DC | PRN
  Administered 2015-02-24 (×2): 1 mg via INTRAVENOUS

## 2015-02-24 MED ORDER — VANCOMYCIN HCL IN DEXTROSE 1-5 GM/200ML-% IV SOLN
1000.0000 mg | Freq: Two times a day (BID) | INTRAVENOUS | Status: DC
  Administered 2015-02-24 – 2015-02-25 (×2): 1000 mg via INTRAVENOUS
  Filled 2015-02-24 (×4): qty 200

## 2015-02-24 MED ORDER — MIDAZOLAM HCL 5 MG/ML IJ SOLN
INTRAMUSCULAR | Status: AC
Start: 2015-02-24 — End: 2015-02-24
  Filled 2015-02-24: qty 2

## 2015-02-24 MED ORDER — FENTANYL CITRATE (PF) 100 MCG/2ML IJ SOLN
INTRAMUSCULAR | Status: DC | PRN
  Administered 2015-02-24 (×2): 50 ug via INTRAVENOUS

## 2015-02-24 MED ORDER — LIDOCAINE HCL 2 % EX GEL
CUTANEOUS | Status: DC | PRN
  Administered 2015-02-24: 1

## 2015-02-24 MED ORDER — LIDOCAINE HCL (PF) 1 % IJ SOLN
INTRAMUSCULAR | Status: DC | PRN
  Administered 2015-02-24: 6 mL

## 2015-02-24 MED ORDER — FENTANYL CITRATE (PF) 100 MCG/2ML IJ SOLN
INTRAMUSCULAR | Status: AC
  Filled 2015-02-24: qty 4

## 2015-02-24 MED ORDER — DILTIAZEM HCL ER COATED BEADS 120 MG PO CP24
120.0000 mg | ORAL_CAPSULE | Freq: Every day | ORAL | Status: DC
  Administered 2015-02-24 – 2015-02-25 (×2): 120 mg via ORAL
  Filled 2015-02-24 (×2): qty 1

## 2015-02-24 MED ORDER — ENSURE ENLIVE PO LIQD
237.0000 mL | Freq: Two times a day (BID) | ORAL | Status: DC
  Administered 2015-02-25 – 2015-02-28 (×6): 237 mL via ORAL

## 2015-02-24 MED ORDER — PHENYLEPHRINE HCL 0.25 % NA SOLN
NASAL | Status: DC | PRN
  Administered 2015-02-24: 2 via NASAL

## 2015-02-24 NOTE — Progress Notes (Signed)
Name: Shane Campos MRN: [id number] DOB: 06/20/1940    ADMISSION DATE:  02/21/2015 CONSULTATION DATE:  9/21  REFERRING MD :  [facility]  CHIEF COMPLAINT:  SOB, F/C/S  BRIEF PATIENT DESCRIPTION: Emaciated wm with months of purulent sputum and hemoptysis  SIGNIFICANT EVENTS    STUDIES:  9/223 Bronch with BAL and brush.   HISTORY OF PRESENT ILLNESS:  74 yo WM, life long 1 ppd smoker(quit 1 month ago due to sob/cough) history of skin cancer face with removal in past and with presumed reoccurrence left temporal area, who presents to [facility] ED with 3 months of cough, 2 months of purulent sputum, 2 weeks of hemoptysis plus fever , chills and sweats for 3 months. He has resisted family encouragement to seek medical care. CT of chest demonstrates 7.6x 6.7 mass/consolidation left lingular region with infringement of lul bronchus. He also has Afib with RVR along with hyponatremia and 30 lbs weight loss over several months. He is a poor historian and has somewhat of a dull affect(possible mets)PCCCM asked to consult for lul cavitary mass. When asked if he would want treatment for lung cancer the answer was "yes".  SUBJECTIVE: No events overnight.  VITAL SIGNS: Temp:  [97 F (36.1 C)-98.9 F (37.2 C)] 97.9 F (36.6 C) (09/23 0752) Pulse Rate:  [35-110] 35 (09/23 0905) Resp:  [15-30] 21 (09/23 0905) BP: (94-139)/(55-84) 119/59 mmHg (09/23 0905) SpO2:  [86 %-99 %] 98 % (09/23 0905) Weight:  [68.947 kg (152 lb)-68.992 kg (152 lb 1.6 oz)] 68.947 kg (152 lb) (09/23 0752)  PHYSICAL EXAMINATION: General: Emaciated white male with dull affect Neuro:  Dull affect, mae x 4 and follows commands HEENT:  No jvd/LAN Cardiovascular:  hsir ir a fib Lungs:  Coarse rhonchi left > right Abdomen:  Soft + bs, left inguinal herina Musculoskeletal:  intact Skin:  Warm and dry, left temporal  lesion and left ear lesion noted   Recent Labs Lab 02/21/15 2302 02/22/15 0551 02/24/15 0320  NA 124* 122* 127*   K 3.9 3.7 3.6  CL 90* 89* 95*  CO2 '25 23 26  '$ BUN '15 13 12  '$ CREATININE 0.95 0.79 0.81  GLUCOSE 120* 104* 102*    Recent Labs Lab 02/21/15 2302 02/22/15 0551  HGB 10.5* 10.0*  HCT 31.2* 29.6*  WBC 8.5 7.8  PLT 312 315   No results found. I reviewed chest CT myself, posterior segment of LUL cavitary lesion noted.  Discussion: 74 yo WM, life long 1 ppd smoker(quit 1 month ago due to sob/cough) history of skin cancer face with removal in past and with presumed reoccurrence left temporal area, who presents to [facility] ED with 3 months of cough, 2 months of purulent sputum, 2 weeks of hemoptysis plus fever , chills and sweats for 3 months. He has resisted family encouragement to seek medical care. CT of chest demonstrates 7.6x 6.7 mass/consolidation left lingular region with infringement of lul bronchus. He also has Afib with RVR along with hyponatremia and 30 lbs weight loss over several months. He is a poor historian and has somewhat of a dull affect(possible mets)PCCCM asked to consult for lul cavitary mass. When asked if he would want treatment for lung cancer the answer was "yes".  ASSESSMENT: Principal Problem:   Cavitary lesion of lung, left, rule out TB(doubt)   CAP (community acquired pneumonia)   Atrial fibrillation with RVR   Hyponatremia   Anemia   Smoker   Recurrent skin cancer   PLAN: Plan bronch today.  Will sent fluid and cytology with bronch.  Consider ct head and PET scan in future.  Agree with Abx till narrow with culture data  Consider skin bx with derm consult as outpatient.  Rush Farmer, M.D. [facility] Pulmonary/Critical Care Medicine. Pager: [phone]. After hours pager: [phone].

## 2015-02-24 NOTE — Progress Notes (Signed)
No hemoptysis noted the whole shift.

## 2015-02-24 NOTE — Progress Notes (Signed)
Back from endoscopy awake and alert. Not in any form of distress.

## 2015-02-24 NOTE — Progress Notes (Signed)
Patient ID: Shane Campos, male   DOB: Sep 22, 1940, 74 y.o.   MRN: [id number]  [facility] PROGRESS NOTE  WALTON DIGILIO KVQ:[id number] DOB: 12/06/1940 DOA: 02/21/2015   PCP: Pt not sure of the name of PCP at this time, will ask case manager to check for pt    Brief narrative:    74 yo male with long history of 1 ppd cigarettes smoking, history of skin cancer face with removal in past and with presumed reoccurrence left temporal area, who presented to [facility] ED with main concern of 2 months duration of persistent productive cough of purulent sputum associated with fevers, chills, malaise, hemoptysis, night sweats, ~ 30 lbs weight loss. Initial work up included CT chest which was notable for 7.6 x 6.7 cm mass/consolidation left lingular region with infringement of LUL bronchus. In ED, pt was also noted to be in a-fib with RVR and with Na 124. TRH asked to admit to SDU and PCCM consulted for assistance with LUL cavitary mass.   Major events since admission: 9/23 - bronchoscopy by Dr. Nelda Marseille   Assessment/Plan:    Principal Problem:   Acute respiratory failure with hypoxia - appears to be secondary to LUL cavitary lesion, ? RUL PNA with also possible malignancy in pt who has long hx of smoking, COPD - appreciate PCCM team assistance, pt is now s/p bronchoscopy - fluid was sent for AFB cx with smear, gram stain, cytology, fungus cx with smear (9/23) - please note that preliminary sputum culture from 9/21 with staph aureus but final report pending   - so far urine legionella and strep pneumo negative, Quant TB gold pending  - continue Vancomycin and Fortaz day day #3 until we have more date back  Active Problems:   Atrial fibrillation with RVR, CHADS 2 = 2 (age and HTN) - currently on metoprolol 25 mg PO BID and Cardizem 30 mg PO QID - rate is controlled ~ 80 - 90 bpm - will continue same regimen, plan on transitioning to Cardizem 120 mg PO QD - we have discussed AC, pt with rather  flat affect, I have called his son TIm but unable to leave message, no automated answering  - cardiology also consulted for assistance     Cavitary lesion of lung, LUL, ? malignant process  - management as noted above - continue to follow up on PCCM recommendations     CAP (community acquired pneumonia), ? lobar PNA, RUL - work still in progress as PNA can not be entirely ruled out - preliminary sputum culture from 9/21 with staph aureus and final report pending  - keep on Vancomycin and Tressie Ellis for now day #3 until we get more results back  - follow up on bronch analysis     Hyponatremia, ? SIADH - Na trending up from 124 --> 122 --> 127 - repeat BMP in AM    Smoker - pt already quit about 1 month ago due to symptoms - encouraged not to resume     Anemia of chronic disease, ? IDA - no signs of bleeding, Hg stable ~ 10    Recurrent skin cancer - will need outpatient follow up     Moderate PCM - nutritionist consulted     Hiccups - better with Thorazine   DVT prophylaxis - Lovenox SQ  Code Status: Full.  Family Communication:  plan of care discussed with the patient, called son Octavia Bruckner at [phone], unable to leave message  Disposition Plan: Home when stable.  IV access:  Peripheral IV  Procedures and diagnostic studies:    Ct Angio Chest Pe W/cm &/or Wo Cm 02/22/2015   No CT evidence of pulmonary embolism.  Left upper lobe/lingula cavitary mass with extension into the left hilum and mediastinum. Differential includes a cavitary neoplasm or an infectious process such as fungal infection, abscess, or TB. Other etiologies are not excluded. Correlation with sputum cultures and pulmonary consult is advised. Bronchoscopy may provide better evaluation if clinically indicated.  Focal right upper lobe tree-in-bud nodular appearance compatible with an infectious process.    Dg Chest Port 1 View 02/21/2015   Significant left upper lobe opacity, possibly an infectious process. Followup PA  and lateral chest X-ray is recommended in 3-4 weeks following trial of antibiotic therapy to ensure resolution and exclude underlying malignancy.    Medical Consultants:  PCCM Cardiology   Other Consultants:  [place]   IAnti-Infectives:   Vancomycin 9/21 --> Tressie Ellis 9/21 -->  Faye Ramsay, MD  [facility] Pager [phone]  If 7PM-7AM, please contact night-coverage [email or web address] Password [email or web address] 02/24/2015, 10:40 AM   LOS: 2 days   HPI/Subjective: No events overnight. Still with persistent cough.   Objective: Filed Vitals:   02/24/15 0945 02/24/15 0950 02/24/15 0955 02/24/15 1000  BP: 117/66 107/82 123/61 118/64  Pulse: 107 110 105 95  Temp:      TempSrc:      Resp: '24 29 23 27  '$ Height:      Weight:      SpO2: 96% 91% 91% 93%    Intake/Output Summary (Last 24 hours) at 02/24/15 1040 Last data filed at 02/24/15 0700  Gross per 24 hour  Intake   2800 ml  Output   1375 ml  Net   1425 ml    Exam:   General:  Pt is alert, NAD  Cardiovascular: Irregular rate and rhythm,  no rubs, no gallops  Respiratory: Rhonchi L > R side, diminished breath sounds at bases   Abdomen: Soft, non tender, non distended, bowel sounds present, no guarding  Extremities: pulses DP and PT palpable bilaterally  Neuro: Grossly nonfocal  Data Reviewed: Basic Metabolic Panel:  Recent Labs Lab 02/21/15 2302 02/22/15 0551 02/24/15 0320  NA 124* 122* 127*  K 3.9 3.7 3.6  CL 90* 89* 95*  CO2 '25 23 26  '$ GLUCOSE 120* 104* 102*  BUN '15 13 12  '$ CREATININE 0.95 0.79 0.81  CALCIUM 8.3* 7.9* 8.0*  PHOS  --   --  2.9   Liver Function Tests:  Recent Labs Lab 02/22/15 0551 02/24/15 0320  AST 57*  --   ALT 49  --   ALKPHOS 129*  --   BILITOT 1.4*  --   PROT 6.2*  --   ALBUMIN 1.9* 1.7*   CBC:  Recent Labs Lab 02/21/15 2302 02/22/15 0551  WBC 8.5 7.8  NEUTROABS  --  4.6  HGB 10.5* 10.0*  HCT 31.2* 29.6*  MCV 89.9 88.9  PLT 312 315    Recent Results (from  the past 240 hour(s))  Blood culture (routine x 2)     Status: None (Preliminary result)   Collection Time: 02/22/15 12:58 AM  Result Value Ref Range Status   Specimen Description BLOOD LEFT ANTECUBITAL  Final   Special Requests BOTTLES DRAWN AEROBIC AND ANAEROBIC 5CC  Final   Culture NO GROWTH 1 DAY  Final   Report Status PENDING  Incomplete  Blood culture (routine x 2)     Status: None (Preliminary  result)   Collection Time: 02/22/15  1:05 AM  Result Value Ref Range Status   Specimen Description BLOOD LEFT HAND  Final   Special Requests BOTTLES DRAWN AEROBIC AND ANAEROBIC 5CC  Final   Culture NO GROWTH 1 DAY  Final   Report Status PENDING  Incomplete  AFB culture with smear     Status: None (Preliminary result)   Collection Time: 02/22/15  7:00 AM  Result Value Ref Range Status   Specimen Description SPUTUM  Final   Special Requests NONE  Final   Acid Fast Smear   Final    NO ACID FAST BACILLI SEEN Performed at [facility]    Culture   Final    CULTURE WILL BE EXAMINED FOR 6 WEEKS BEFORE ISSUING A FINAL REPORT Performed at [facility]    Report Status PENDING  Incomplete  Gram stain     Status: None   Collection Time: 02/22/15  7:00 AM  Result Value Ref Range Status   Specimen Description SPUTUM  Final   Special Requests NONE  Final   Gram Stain   Final    MODERATE WBC PRESENT,BOTH PMN AND MONONUCLEAR ABUNDANT GRAM POSITIVE COCCI IN PAIRS FEW GRAM POSITIVE COCCI IN CHAINS FEW GRAM NEGATIVE RODS RARE GRAM POSITIVE RODS    Report Status 02/22/2015 FINAL  Final  Culture, sputum-assessment     Status: None   Collection Time: 02/22/15  7:46 AM  Result Value Ref Range Status   Specimen Description SPUTUM  Final   Special Requests NONE  Final   Sputum evaluation   Final    THIS SPECIMEN IS ACCEPTABLE. RESPIRATORY CULTURE REPORT TO FOLLOW.   Report Status 02/22/2015 FINAL  Final  Culture, respiratory (NON-Expectorated)     Status: None (Preliminary  result)   Collection Time: 02/22/15  7:46 AM  Result Value Ref Range Status   Specimen Description SPUTUM  Final   Special Requests NONE  Final   Gram Stain   Final    MODERATE WBC PRESENT,BOTH PMN AND MONONUCLEAR NO SQUAMOUS EPITHELIAL CELLS SEEN ABUNDANT GRAM POSITIVE COCCI IN PAIRS FEW GRAM NEGATIVE RODS RARE GRAM POSITIVE RODS    Culture   Final    ABUNDANT STAPHYLOCOCCUS AUREUS Note: RIFAMPIN AND GENTAMICIN SHOULD NOT BE USED AS SINGLE DRUGS FOR TREATMENT OF STAPH INFECTIONS. Performed at [facility]    Report Status PENDING  Incomplete  MRSA PCR Screening     Status: None   Collection Time: 02/22/15  2:30 PM  Result Value Ref Range Status   MRSA by PCR NEGATIVE NEGATIVE Final    Comment:        The GeneXpert MRSA Assay (FDA approved for NASAL specimens only), is one component of a comprehensive MRSA colonization surveillance program. It is not intended to diagnose MRSA infection nor to guide or monitor treatment for MRSA infections.      Scheduled Meds: . cefTAZidime (FORTAZ)  IV  1 g Intravenous 3 times per day  . diltiazem  30 mg Oral 4 times per day  . metoprolol tartrate  25 mg Oral BID  . pantoprazole  40 mg Oral Daily  . vancomycin  750 mg Intravenous Q12H   Continuous Infusions: . sodium chloride 100 mL/hr at 02/23/15 0800

## 2015-02-24 NOTE — Progress Notes (Signed)
Video bronchoscopy performed.  Intervention bronchial washing Intervention bronchial brushing.  Patient tolerated well.  Will continue to monitor.

## 2015-02-24 NOTE — Progress Notes (Signed)
Initial Nutrition Assessment  DOCUMENTATION CODES:   Non-severe (moderate) malnutrition in context of acute illness/injury  INTERVENTION:   Ensure Enlive po BID, each supplement provides 350 kcal and 20 grams of protein  NUTRITION DIAGNOSIS:   Malnutrition related to acute illness as evidenced by mild depletion of body fat, mild depletion of muscle mass, energy intake < 75% for > 7 days.  GOAL:   Patient will meet greater than or equal to 90% of their needs  MONITOR:   PO intake, Supplement acceptance, Diet advancement, Labs, Weight trends, Skin, I & O's  REASON FOR ASSESSMENT:   Consult Assessment of nutrition requirement/status  ASSESSMENT:   74 yo male with long history of 1 ppd cigarettes smoking, history of skin cancer face with removal in past and with presumed reoccurrence left temporal area, who presented to [facility] ED with main concern of 2 months duration of persistent productive cough of purulent sputum associated with fevers, chills, malaise, hemoptysis, night sweats, ~ 30 lbs weight loss. Initial work up included CT chest which was notable for 7.6 x 6.7 cm mass/consolidation left lingular region with infringement of LUL bronchus. In ED, pt was also noted to be in a-fib with RVR and with Na 124. TRH asked to admit to SDU and PCCM consulted for assistance with LUL cavitary mass.   Pt admitted with a-fib with RVR.   Spoke with RN, who reports pt has just returned from bronchoscopy. Pt was sitting in bed, consuming lunch at time of visit. He endorses a general decline in the health over the past 2-3 months, including decreased mobility, poor appetite, and weight loss. Pt reveals his usual weight is between 160-170# and estimates a 25# wt loss over the past month. However, unable to confirm statement due to limited weight hx.   Pt reports PTA, he was consuming 1 meal per day (a can of soup). Pt reveals that "food doesn't have any taste- everything tastes like leather". Noted  90-95% meal completion per doc flowsheets, however suspect inaccuracies. Pt consumed approximately 50% of his coleslaw, 1/3 of his fish, and 50% of his roll. He was consuming 1-2 bottles of chocolate Ensure PTA, as that was the only flavor that was not offensive to him.   Nutrition-Focused physical exam completed. Findings are mild to moderate fat depletion, mild to moderate muscle depletion, and mild edema. Pt reports that he used to be very active, however, has since been very dependent on his son, who resides with him.  Discussed importance of good nutritional intake to support healing. Pt is amenable to consuming chocolate Ensure to support optimal intake. Encouraged pt to consume meals and supplements, as well as continue supplements at home after discharge.   Labs reviewed: Na: 127.   Diet Order:  Diet regular Room service appropriate?: Yes; Fluid consistency:: Thin  Skin:  Reviewed, no issues  Last BM:  PTA  Height:   Ht Readings from Last 1 Encounters:  02/24/15 6' (1.829 m)    Weight:   Wt Readings from Last 1 Encounters:  02/24/15 152 lb (68.947 kg)    Ideal Body Weight:  80.9 kg  BMI:  Body mass index is 20.61 kg/(m^2).  Estimated Nutritional Needs:   Kcal:  2000-2200  Protein:  90-100 grams  Fluid:  >2.0 L  EDUCATION NEEDS:   Education needs addressed  Jenifer A. Jimmye Norman, RD, LDN, CDE Pager: [phone] After hours Pager: [phone]

## 2015-02-24 NOTE — Consult Note (Signed)
Patient ID: Shane Campos MRN: [id number], DOB/AGE: [birth year]   Admit date: 02/21/2015   Primary Physician: No primary care provider on file. Primary Cardiologist: New  Pt. Profile:  74 y/o male undergoing w/u for possible lung CA, found to have new onset atrial fibrillation.   Problem List  Past Medical History  Diagnosis Date  . Cancer     skin ca    History reviewed. No pertinent past surgical history.   Allergies  No Known Allergies  HPI  The patient is a 74 y/o male with no prior cardiac history. He is a chronic smoker with a h/o COPD and skin cancer on his face, s/p removal in the past, but with presumed reoccurrence on his left temporal area.   He was admitted to [facility] for w/u on 02/22/15, when he presented with a complaint of 2 month h/o a persistent, productive cough with purulent sputum. This has also been accompanied by hemoptysis, fever, chills, night sweats and unexplained ~30 lb weight loss. Subsequent CT of the chest revealed a 7.6 x 6.7 cm mass/consolidation in the left lingular region with infringement of LUL bronchus. He is undergoing further w/u by PCCM for LUL cavitary lesion concerning for possible malignancy and ? RUL PNA.   His hospital course has been complicated by new onset atrial fibrillation. He was initially noted to have a rapid ventricular response, however his rate is now well controlled on Cardizem. Cardiology has been consulted to help determine if he should be placed on long term oral anticoagulation.   Home Medications  Prior to Admission medications   Medication Sig Start Date End Date Taking? Authorizing Provider  Pseudoephedrine-Ibuprofen (ADVIL COLD & SINUS LIQUI-GELS) 30-200 MG CAPS Take 1 tablet by mouth daily as needed.   Yes Historical Provider, MD    Family History  History reviewed. No pertinent family history.  Social History  Social History   Social History  . Marital Status: Divorced    Spouse Name: N/A  .  Number of Children: N/A  . Years of Education: N/A   Occupational History  . Not on file.   Social History Main Topics  . Smoking status: Former Research scientist (life sciences)  . Smokeless tobacco: Not on file  . Alcohol Use: No  . Drug Use: No  . Sexual Activity: Not on file   Other Topics Concern  . Not on file   Social History Narrative     Review of Systems General:  No chills, fever, night sweats or weight changes.  Cardiovascular:  No chest pain, dyspnea on exertion, edema, orthopnea, palpitations, paroxysmal nocturnal dyspnea. Dermatological: No rash, lesions/masses Respiratory: No cough, dyspnea Urologic: No hematuria, dysuria Abdominal:   No nausea, vomiting, diarrhea, bright red blood per rectum, melena, or hematemesis Neurologic:  No visual changes, wkns, changes in mental status. All other systems reviewed and are otherwise negative except as noted above.  Physical Exam  Blood pressure 91/54, pulse 62, temperature 97.2 F (36.2 C), temperature source Oral, resp. rate 26, height 6' (1.829 m), weight 152 lb (68.947 kg), SpO2 97 %.  General: WEak chronically ill appearingNAD Psych: Normal affect. Neuro: Alert and oriented X 3. Moves all extremities spontaneously. HEENT: Normal  Neck: Supple without bruits or JVD. Lungs:  Resp regular and unlabored, decreased BS throughout with scattered rhonchi Heart: irregularly irregular. Regular rate.  Abdomen: Soft, non-tender, non-distended, BS + x 4.  Extremities: No clubbing, cyanosis or edema. DP/PT/Radials 2+ and equal bilaterally.  Labs  Troponin [facility] of  Care Test)  Recent Labs  02/21/15 2253  TROPIPOC 0.02   No results for input(s): CKTOTAL, CKMB, TROPONINI in the last 72 hours. Lab Results  Component Value Date   WBC 7.8 02/22/2015   HGB 10.0* 02/22/2015   HCT 29.6* 02/22/2015   MCV 88.9 02/22/2015   PLT 315 02/22/2015    Recent Labs Lab 02/22/15 0551 02/24/15 0320  NA 122* 127*  K 3.7 3.6  CL 89* 95*  CO2 23 26    BUN 13 12  CREATININE 0.79 0.81  CALCIUM 7.9* 8.0*  PROT 6.2*  --   BILITOT 1.4*  --   ALKPHOS 129*  --   ALT 49  --   AST 57*  --   GLUCOSE 104* 102*   No results found for: CHOL, HDL, LDLCALC, TRIG No results found for: DDIMER   Radiology/Studies  Ct Angio Chest Pe W/cm &/or Wo Cm  02/22/2015   CLINICAL DATA:  74 year old male with shortness of breath under chest pain and hemoptysis.  EXAM: CT ANGIOGRAPHY CHEST WITH CONTRAST  TECHNIQUE: Multidetector CT imaging of the chest was performed using the standard protocol during bolus administration of intravenous contrast. Multiplanar CT image reconstructions and MIPs were obtained to evaluate the vascular anatomy.  CONTRAST:  68m OMNIPAQUE IOHEXOL 350 MG/ML SOLN  COMPARISON:  Radiograph dated 09/16  FINDINGS: There is a 7.6 x 6.7 cm masslike consolidation with central cavitation in the left infrahilar/ lingula region. The mass extends from the pleural surface into the left hilum. There is encasement and narrowing of the left upper lobe bronchus as well as encasement of the left upper lobe pulmonary artery branch. There is diffuse opacification of the left upper lobe with air bronchograms. There is a small left pleural effusion.  There is a focal area of nodularity with "Tree in bud appearance" in the right upper lobe (series 6, image 43 -45) the remainder of the lungs are clear. There is no pneumothorax.  The thoracic aorta appears unremarkable. There is no CT evidence of pulmonary artery embolism. Top-normal subcarinal lymph nodes. There is no cardiomegaly or pericardial effusion. The thyroid gland and esophagus appear grossly unremarkable.  There is no axillary adenopathy. The chest wall soft tissues appear unremarkable. The osseous structures appear intact. There is no evidence of bony erosion or acute fracture. Mild degenerative changes of the spine.  Review of the MIP images confirms the above findings.  IMPRESSION: No CT evidence of pulmonary  embolism.  Left upper lobe/lingula cavitary mass with extension into the left hilum and mediastinum. Differential includes a cavitary neoplasm or an infectious process such as fungal infection, abscess, or TB. Other etiologies are not excluded. Correlation with sputum cultures and pulmonary consult is advised. Bronchoscopy may provide better evaluation if clinically indicated.  Focal right upper lobe tree-in-bud nodular appearance compatible with an infectious process.  Small left pleural effusion.   Electronically Signed   By: AAnner CreteM.D.   On: 02/22/2015 00:29   Dg Chest Port 1 View  02/21/2015   CLINICAL DATA:  Dyspnea and tachycardia tonight  EXAM: PORTABLE CHEST - 1 VIEW  COMPARISON:  02/10/2005  FINDINGS: Lungs are mildly hyperinflated. Heart size is normal. There is dense opacity within the left upper lobe, possibly infectious. Right lung is clear. No pulmonary edema.  IMPRESSION: Significant left upper lobe opacity, possibly an infectious process. Followup PA and lateral chest X-ray is recommended in 3-4 weeks following trial of antibiotic therapy to ensure resolution and exclude underlying  malignancy.   Electronically Signed   By: Nolon Nations M.D.   On: 02/21/2015 23:14    ECG  Atrial fibrillation w/ RVR   ASSESSMENT AND PLAN  1. New Onset Atrial Fibrillation: in the setting of possible malignancy and ? RUL PNA. His rate is now well controlled on cardizem. Given concerns for possible malignancy, he is not a candidate for oral anticoagulation at this time. Please call if we can be of any further assistance.   This patients CHA2DS2-VASc Score and unadjusted Ischemic Stroke Rate (% per year) is equal to 2.2 % stroke rate/year from a score of 2  Above score calculated as 1 point each if present [CHF, HTN, DM, Vascular=MI/PAD/Aortic Plaque, Age if 65-74, or Male] Above score calculated as 2 points each if present [Age > 75, or Stroke/TIA/TE]   Signed, Lyda Jester,  PA-C 02/24/2015, 3:14 PM   Patient seen and examined with Melina Copa, PA-C. We discussed all aspects of the encounter. I agree with the assessment and plan as stated above.   74 y/o male with COPD admitted with hemoptysis and found to have lung mass and new onset AF. Copious amount of blood in airways on bronch. Given bleeding he is not a candidate for anti-coagulation for his AF. Rate currently well controlled on cardizem. Would consider echo. We will sign off. Please call with ?s.   Bensimhon, Daniel,MD 4:33 PM

## 2015-02-24 NOTE — Progress Notes (Signed)
ANTIBIOTIC CONSULT NOTE - FOLLOW UP  Pharmacy Consult:  Vancomycin / Shane Campos Indication:  PNA  No Known Allergies  Patient Measurements: Height: 6' (182.9 cm) Weight: 152 lb (68.947 kg) IBW/kg (Calculated) : 77.6  Vital Signs: Temp: 97.4 F (36.3 C) (09/23 1605) Temp Source: Oral (09/23 1605) BP: 114/39 mmHg (09/23 1605) Pulse Rate: 79 (09/23 1605) Intake/Output from previous day: 09/22 0701 - 09/23 0700 In: 3300 [P.O.:450; I.V.:2400; IV Piggyback:450] Out: 1925 [Urine:1925] Intake/Output from this shift: Total I/O In: 650 [P.O.:200; I.V.:400; IV Piggyback:50] Out: 400 [Urine:400]  Labs:  Recent Labs  02/21/15 2302 02/22/15 0551 02/22/15 2038 02/24/15 0320  WBC 8.5 7.8  --   --   HGB 10.5* 10.0*  --   --   PLT 312 315  --   --   LABCREA  --   --  90.06  --   CREATININE 0.95 0.79  --  0.81   Estimated Creatinine Clearance: 76.8 mL/min (by C-G formula based on Cr of 0.81).  Recent Labs  02/24/15 1525  Bode     Microbiology: Recent Results (from the past 720 hour(s))  Blood culture (routine x 2)     Status: None (Preliminary result)   Collection Time: 02/22/15 12:58 AM  Result Value Ref Range Status   Specimen Description BLOOD LEFT ANTECUBITAL  Final   Special Requests BOTTLES DRAWN AEROBIC AND ANAEROBIC 5CC  Final   Culture NO GROWTH 2 DAYS  Final   Report Status PENDING  Incomplete  Blood culture (routine x 2)     Status: None (Preliminary result)   Collection Time: 02/22/15  1:05 AM  Result Value Ref Range Status   Specimen Description BLOOD LEFT HAND  Final   Special Requests BOTTLES DRAWN AEROBIC AND ANAEROBIC 5CC  Final   Culture NO GROWTH 2 DAYS  Final   Report Status PENDING  Incomplete  AFB culture with smear     Status: None (Preliminary result)   Collection Time: 02/22/15  7:00 AM  Result Value Ref Range Status   Specimen Description SPUTUM  Final   Special Requests NONE  Final   Acid Fast Smear   Final    NO ACID FAST BACILLI  SEEN Performed at [facility]    Culture   Final    CULTURE WILL BE EXAMINED FOR 6 WEEKS BEFORE ISSUING A FINAL REPORT Performed at [facility]    Report Status PENDING  Incomplete  Gram stain     Status: None   Collection Time: 02/22/15  7:00 AM  Result Value Ref Range Status   Specimen Description SPUTUM  Final   Special Requests NONE  Final   Gram Stain   Final    MODERATE WBC PRESENT,BOTH PMN AND MONONUCLEAR ABUNDANT GRAM POSITIVE COCCI IN PAIRS FEW GRAM POSITIVE COCCI IN CHAINS FEW GRAM NEGATIVE RODS RARE GRAM POSITIVE RODS    Report Status 02/22/2015 FINAL  Final  Culture, sputum-assessment     Status: None   Collection Time: 02/22/15  7:46 AM  Result Value Ref Range Status   Specimen Description SPUTUM  Final   Special Requests NONE  Final   Sputum evaluation   Final    THIS SPECIMEN IS ACCEPTABLE. RESPIRATORY CULTURE REPORT TO FOLLOW.   Report Status 02/22/2015 FINAL  Final  Culture, respiratory (NON-Expectorated)     Status: None (Preliminary result)   Collection Time: 02/22/15  7:46 AM  Result Value Ref Range Status   Specimen Description SPUTUM  Final  Special Requests NONE  Final   Gram Stain   Final    MODERATE WBC PRESENT,BOTH PMN AND MONONUCLEAR NO SQUAMOUS EPITHELIAL CELLS SEEN ABUNDANT GRAM POSITIVE COCCI IN PAIRS FEW GRAM NEGATIVE RODS RARE GRAM POSITIVE RODS    Culture   Final    ABUNDANT STAPHYLOCOCCUS AUREUS Note: RIFAMPIN AND GENTAMICIN SHOULD NOT BE USED AS SINGLE DRUGS FOR TREATMENT OF STAPH INFECTIONS. Performed at [facility]    Report Status PENDING  Incomplete  MRSA PCR Screening     Status: None   Collection Time: 02/22/15  2:30 PM  Result Value Ref Range Status   MRSA by PCR NEGATIVE NEGATIVE Final    Comment:        The GeneXpert MRSA Assay (FDA approved for NASAL specimens only), is one component of a comprehensive MRSA colonization surveillance program. It is not intended to diagnose MRSA infection  nor to guide or monitor treatment for MRSA infections.       Assessment: 74 YOM with possible PNA to continue on vancomycin and ceftazidime.  Patient's renal function has been stable and his vancomycin trough is sub-therapeutic.  Vanc 9/21>> Ceftaz 9/21>>  9/23 VT = 13 mcg/mL on '750mg'$  q12 >> 1gm q12  9/21 resp Cx - abundant staph aureus Urine Strep neg AFB IP   Goal of Therapy:  Vancomycin trough level 15-20 mcg/ml   Plan:  - Increase vanc to 1gm IV q12H - Continue Fortaz 1gm IV Q8H - Monitor renal fxn, clinical progress, weekly vanc trough   Ayat Drenning D. Mina Marble, PharmD, BCPS Pager:  [phone] 02/24/2015, 4:35 PM

## 2015-02-24 NOTE — Procedures (Signed)
Bronchoscopy Procedure Note Shane Campos [id number] 04/10/1941  Procedure: Bronchoscopy Indications: Diagnostic evaluation of the airways and Obtain specimens for culture and/or other diagnostic studies  Procedure Details Consent: Risks of procedure as well as the alternatives and risks of each were explained to the (patient/caregiver).  Consent for procedure obtained. Time Out: Verified patient identification, verified procedure, site/side was marked, verified correct patient position, special equipment/implants available, medications/allergies/relevent history reviewed, required imaging and test results available.  Performed  In preparation for procedure, patient was given 100% FiO2 and bronchoscope lubricated. Sedation: Benzodiazepines and fentanyl  Airway entered and the following bronchi were examined: RUL, RML, RLL, LUL, LLL and Bronchi.   Procedures performed: Brushings performed from LUL. Bronchoscope removed.  , Patient placed back on 100% FiO2 at conclusion of procedure.    External compression of the LUL with excessive amount of blood, unable to visualize any endobronchial masses.  Evaluation Hemodynamic Status: BP stable throughout; O2 sats: stable throughout Patient's Current Condition: stable Specimens:  Sent serosanguinous fluid Complications: No apparent complications Patient did tolerate procedure well.   Jennet Maduro 02/24/2015

## 2015-02-25 LAB — CBC
HEMATOCRIT: 30.8 % — AB (ref 39.0–52.0)
HEMOGLOBIN: 10.3 g/dL — AB (ref 13.0–17.0)
MCH: 30.3 pg (ref 26.0–34.0)
MCHC: 33.4 g/dL (ref 30.0–36.0)
MCV: 90.6 fL (ref 78.0–100.0)
Platelets: 311 10*3/uL (ref 150–400)
RBC: 3.4 MIL/uL — AB (ref 4.22–5.81)
RDW: 13.9 % (ref 11.5–15.5)
WBC: 9.5 10*3/uL (ref 4.0–10.5)

## 2015-02-25 LAB — RENAL FUNCTION PANEL
ANION GAP: 8 (ref 5–15)
Albumin: 1.7 g/dL — ABNORMAL LOW (ref 3.5–5.0)
BUN: 13 mg/dL (ref 6–20)
CALCIUM: 8.1 mg/dL — AB (ref 8.9–10.3)
CHLORIDE: 97 mmol/L — AB (ref 101–111)
CO2: 25 mmol/L (ref 22–32)
Creatinine, Ser: 0.77 mg/dL (ref 0.61–1.24)
GFR calc non Af Amer: >60 mL/min (ref >60)
Glucose, Bld: 113 mg/dL — ABNORMAL HIGH (ref 65–99)
POTASSIUM: 3.6 mmol/L (ref 3.5–5.1)
Phosphorus: 2.9 mg/dL (ref 2.5–4.6)
Sodium: 130 mmol/L — ABNORMAL LOW (ref 135–145)

## 2015-02-25 LAB — CULTURE, RESPIRATORY W GRAM STAIN

## 2015-02-25 LAB — CULTURE, RESPIRATORY

## 2015-02-25 MED ORDER — POLYETHYLENE GLYCOL 3350 17 G PO PACK
34.0000 g | PACK | Freq: Once | ORAL | Status: AC
  Administered 2015-02-25: 34 g via ORAL
  Filled 2015-02-25: qty 2

## 2015-02-25 MED ORDER — LEVOFLOXACIN IN D5W 750 MG/150ML IV SOLN
750.0000 mg | INTRAVENOUS | Status: DC
  Administered 2015-02-25 – 2015-02-26 (×2): 750 mg via INTRAVENOUS
  Filled 2015-02-25 (×3): qty 150

## 2015-02-25 MED ORDER — BISACODYL 5 MG PO TBEC
10.0000 mg | DELAYED_RELEASE_TABLET | Freq: Once | ORAL | Status: AC
Start: 2015-02-25 — End: 2015-02-25
  Administered 2015-02-25: 10 mg via ORAL
  Filled 2015-02-25: qty 2

## 2015-02-25 MED ORDER — DOCUSATE SODIUM 100 MG PO CAPS
100.0000 mg | ORAL_CAPSULE | Freq: Two times a day (BID) | ORAL | Status: DC
  Administered 2015-02-26 – 2015-02-27 (×3): 100 mg via ORAL
  Filled 2015-02-25 (×3): qty 1

## 2015-02-25 MED ORDER — CHLORPROMAZINE HCL 10 MG PO TABS
10.0000 mg | ORAL_TABLET | Freq: Three times a day (TID) | ORAL | Status: DC | PRN
  Administered 2015-02-25 – 2015-02-26 (×3): 10 mg via ORAL
  Filled 2015-02-25 (×5): qty 1

## 2015-02-25 NOTE — Progress Notes (Signed)
Patient ID: Shane Campos, male   DOB: 03/30/1941, 74 y.o.   MRN: [id number]  [facility] PROGRESS NOTE  Shane Campos NWG:[id number] DOB: [birth year] DOA: 02/21/2015   PCP: Pt not sure of the name of PCP at this time, will ask case manager to check for pt    Brief narrative:    74 yo male with long history of 1 ppd cigarettes smoking, history of skin cancer face with removal in past and with presumed reoccurrence left temporal area, who presented to [facility] ED with main concern of 2 months duration of persistent productive cough of purulent sputum associated with fevers, chills, malaise, hemoptysis, night sweats, ~ 30 lbs weight loss. Initial work up included CT chest which was notable for 7.6 x 6.7 cm mass/consolidation left lingular region with infringement of LUL bronchus. In ED, pt was also noted to be in a-fib with RVR and with Na 124. TRH asked to admit to SDU and PCCM consulted for assistance with LUL cavitary mass.   Major events since admission: 9/23 - bronchoscopy by Dr. Nelda Marseille   Assessment/Plan:    Principal Problem:   Acute respiratory failure with hypoxia - appears to be secondary to LUL cavitary lesion, ? RUL PNA with also possible malignancy in pt who has long hx of smoking, COPD - appreciate PCCM team assistance, pt is now s/p bronchoscopy 9/23 - BAL fluid was sent for AFB cx with smear, gram stain, cytology, fungus cx with smear (9/23), results still pending - Sputum AFB negative 1, still awaiting the results on BAL - please note that preliminary sputum culture from 9/21 with staph aureus but final report pending   - so far urine legionella and strep pneumo negative, Quant TB gold is negative - continue Vancomycin and Fortaz day day #3 until we have more date back  Active Problems:   Atrial fibrillation with RVR, CHADS 2 = 2 (age and HTN) - currently on metoprolol 25 mg PO BID , Cardizem 30 mg PO QID transition to Cardizem CD 120 mg daily - rate is controlled ~  80 - 90 bpm - Cardiology input appreciated, patient is not a candidate for anticoagulation for his A. fib given copious amount of blood in her way on bronch.  Cavitary lesion of lung, LUL, ? malignant process  - management as noted above - continue to follow up on PCCM recommendations  - Awaiting BAL cytology    CAP (community acquired pneumonia), ? lobar PNA, RUL - work still in progress as PNA can not be entirely ruled out - preliminary sputum culture from 9/21 with staph aureus and final report pending  - keep on Vancomycin and Tressie Ellis for now day #4 until we get more results back  - follow up on bronch analysis     Hyponatremia, ? SIADH - Na trending up from 124 --> 122 --> 127-->130 - This is most likely related to lung mass    Smoker - pt already quit about 1 month ago due to symptoms - encouraged not to resume     Anemia of chronic disease, ? IDA - no signs of bleeding, Hg stable ~ 10    Recurrent skin cancer - will need outpatient follow up     Moderate PCM - nutritionist consulted     Hiccups - better with Thorazine   DVT prophylaxis - Lovenox SQ  Code Status: Full.  Family Communication:  plan of care discussed with the patient,none at bedside Disposition Plan: Home when stable.  IV access:  Peripheral IV  Procedures and diagnostic studies:    Bronchoscopy  02/24/2015  Ct Angio Chest Pe W/cm &/or Wo Cm 02/22/2015   No CT evidence of pulmonary embolism.  Left upper lobe/lingula cavitary mass with extension into the left hilum and mediastinum. Differential includes a cavitary neoplasm or an infectious process such as fungal infection, abscess, or TB. Other etiologies are not excluded. Correlation with sputum cultures and pulmonary consult is advised. Bronchoscopy may provide better evaluation if clinically indicated.  Focal right upper lobe tree-in-bud nodular appearance compatible with an infectious process.    Dg Chest Port 1 View 02/21/2015   Significant left  upper lobe opacity, possibly an infectious process. Followup PA and lateral chest X-ray is recommended in 3-4 weeks following trial of antibiotic therapy to ensure resolution and exclude underlying malignancy.    Medical Consultants:  PCCM Cardiology   Other Consultants:  [place]   IAnti-Infectives:   Vancomycin 9/21 --> Tressie Ellis 9/21 -->  ELGERGAWY, DAWOOD, MD  [facility] Pager [phone]  If 7PM-7AM, please contact night-coverage [email or web address] Password [email or web address] 02/25/2015, 9:09 AM   LOS: 3 days   HPI/Subjective: No events overnight. Still with persistent cough.   Objective: Filed Vitals:   02/25/15 0030 02/25/15 0455 02/25/15 0650 02/25/15 0755  BP:   118/48 103/59  Pulse:   71 75  Temp: 97.1 F (36.2 C)   98.4 F (36.9 C)  TempSrc: Oral   Oral  Resp:   21 30  Height:      Weight:  69.4 kg (153 lb)    SpO2:   94% 91%    Intake/Output Summary (Last 24 hours) at 02/25/15 0909 Last data filed at 02/25/15 0700  Gross per 24 hour  Intake    950 ml  Output   1450 ml  Net   -500 ml    Exam:   General:  Pt is alert, NAD  Cardiovascular: Irregular rate and rhythm,  no rubs, no gallops  Respiratory: Rhonchi L > R side, diminished breath sounds at bases   Abdomen: Soft, non tender, non distended, bowel sounds present, no guarding  Extremities: pulses DP and PT palpable bilaterally  Neuro: Grossly nonfocal  Data Reviewed: Basic Metabolic Panel:  Recent Labs Lab 02/21/15 2302 02/22/15 0551 02/24/15 0320 02/25/15 0250  NA 124* 122* 127* 130*  K 3.9 3.7 3.6 3.6  CL 90* 89* 95* 97*  CO2 '25 23 26 25  '$ GLUCOSE 120* 104* 102* 113*  BUN '15 13 12 13  '$ CREATININE 0.95 0.79 0.81 0.77  CALCIUM 8.3* 7.9* 8.0* 8.1*  PHOS  --   --  2.9 2.9   Liver Function Tests:  Recent Labs Lab 02/22/15 0551 02/24/15 0320 02/25/15 0250  AST 57*  --   --   ALT 49  --   --   ALKPHOS 129*  --   --   BILITOT 1.4*  --   --   PROT 6.2*  --   --   ALBUMIN 1.9* 1.7* 1.7*    CBC:  Recent Labs Lab 02/21/15 2302 02/22/15 0551 02/25/15 0250  WBC 8.5 7.8 9.5  NEUTROABS  --  4.6  --   HGB 10.5* 10.0* 10.3*  HCT 31.2* 29.6* 30.8*  MCV 89.9 88.9 90.6  PLT 312 315 311    Recent Results (from the past 240 hour(s))  Blood culture (routine x 2)     Status: None (Preliminary result)   Collection Time: 02/22/15 12:58 AM  Result Value  Ref Range Status   Specimen Description BLOOD LEFT ANTECUBITAL  Final   Special Requests BOTTLES DRAWN AEROBIC AND ANAEROBIC 5CC  Final   Culture NO GROWTH 2 DAYS  Final   Report Status PENDING  Incomplete  Blood culture (routine x 2)     Status: None (Preliminary result)   Collection Time: 02/22/15  1:05 AM  Result Value Ref Range Status   Specimen Description BLOOD LEFT HAND  Final   Special Requests BOTTLES DRAWN AEROBIC AND ANAEROBIC 5CC  Final   Culture NO GROWTH 2 DAYS  Final   Report Status PENDING  Incomplete  AFB culture with smear     Status: None (Preliminary result)   Collection Time: 02/22/15  7:00 AM  Result Value Ref Range Status   Specimen Description SPUTUM  Final   Special Requests NONE  Final   Acid Fast Smear   Final    NO ACID FAST BACILLI SEEN Performed at [facility]    Culture   Final    CULTURE WILL BE EXAMINED FOR 6 WEEKS BEFORE ISSUING A FINAL REPORT Performed at [facility]    Report Status PENDING  Incomplete  Gram stain     Status: None   Collection Time: 02/22/15  7:00 AM  Result Value Ref Range Status   Specimen Description SPUTUM  Final   Special Requests NONE  Final   Gram Stain   Final    MODERATE WBC PRESENT,BOTH PMN AND MONONUCLEAR ABUNDANT GRAM POSITIVE COCCI IN PAIRS FEW GRAM POSITIVE COCCI IN CHAINS FEW GRAM NEGATIVE RODS RARE GRAM POSITIVE RODS    Report Status 02/22/2015 FINAL  Final  Culture, sputum-assessment     Status: None   Collection Time: 02/22/15  7:46 AM  Result Value Ref Range Status   Specimen Description SPUTUM  Final   Special  Requests NONE  Final   Sputum evaluation   Final    THIS SPECIMEN IS ACCEPTABLE. RESPIRATORY CULTURE REPORT TO FOLLOW.   Report Status 02/22/2015 FINAL  Final  Culture, respiratory (NON-Expectorated)     Status: None (Preliminary result)   Collection Time: 02/22/15  7:46 AM  Result Value Ref Range Status   Specimen Description SPUTUM  Final   Special Requests NONE  Final   Gram Stain   Final    MODERATE WBC PRESENT,BOTH PMN AND MONONUCLEAR NO SQUAMOUS EPITHELIAL CELLS SEEN ABUNDANT GRAM POSITIVE COCCI IN PAIRS FEW GRAM NEGATIVE RODS RARE GRAM POSITIVE RODS    Culture   Final    ABUNDANT STAPHYLOCOCCUS AUREUS Note: RIFAMPIN AND GENTAMICIN SHOULD NOT BE USED AS SINGLE DRUGS FOR TREATMENT OF STAPH INFECTIONS. Performed at [facility]    Report Status PENDING  Incomplete  MRSA PCR Screening     Status: None   Collection Time: 02/22/15  2:30 PM  Result Value Ref Range Status   MRSA by PCR NEGATIVE NEGATIVE Final    Comment:        The GeneXpert MRSA Assay (FDA approved for NASAL specimens only), is one component of a comprehensive MRSA colonization surveillance program. It is not intended to diagnose MRSA infection nor to guide or monitor treatment for MRSA infections.   AFB culture with smear     Status: None (Preliminary result)   Collection Time: 02/23/15  5:30 AM  Result Value Ref Range Status   Specimen Description SPUTUM  Final   Special Requests NONE  Final   Acid Fast Smear   Final    NO  ACID FAST BACILLI SEEN Performed at [facility]    Culture   Final    CULTURE WILL BE EXAMINED FOR 6 WEEKS BEFORE ISSUING A FINAL REPORT Performed at [facility]    Report Status PENDING  Incomplete     Scheduled Meds: . cefTAZidime (FORTAZ)  IV  1 g Intravenous 3 times per day  . diltiazem  120 mg Oral Daily  . feeding supplement (ENSURE ENLIVE)  237 mL Oral BID BM  . metoprolol tartrate  25 mg Oral BID  . pantoprazole  40 mg Oral Daily  .  vancomycin  1,000 mg Intravenous Q12H   Continuous Infusions:

## 2015-02-26 ENCOUNTER — Inpatient Hospital Stay (HOSPITAL_COMMUNITY): Payer: Commercial Managed Care - HMO

## 2015-02-26 DIAGNOSIS — J984 Other disorders of lung: Secondary | ICD-10-CM | POA: Insufficient documentation

## 2015-02-26 DIAGNOSIS — R918 Other nonspecific abnormal finding of lung field: Secondary | ICD-10-CM

## 2015-02-26 LAB — RENAL FUNCTION PANEL
ALBUMIN: 1.7 g/dL — AB (ref 3.5–5.0)
Anion gap: 8 (ref 5–15)
BUN: 14 mg/dL (ref 6–20)
CALCIUM: 8.3 mg/dL — AB (ref 8.9–10.3)
CO2: 26 mmol/L (ref 22–32)
CREATININE: 0.77 mg/dL (ref 0.61–1.24)
Chloride: 95 mmol/L — ABNORMAL LOW (ref 101–111)
GFR calc Af Amer: >60 mL/min (ref >60)
GLUCOSE: 101 mg/dL — AB (ref 65–99)
PHOSPHORUS: 2.3 mg/dL — AB (ref 2.5–4.6)
POTASSIUM: 3.3 mmol/L — AB (ref 3.5–5.1)
Sodium: 129 mmol/L — ABNORMAL LOW (ref 135–145)

## 2015-02-26 MED ORDER — DILTIAZEM HCL ER COATED BEADS 180 MG PO CP24
180.0000 mg | ORAL_CAPSULE | Freq: Every day | ORAL | Status: DC
  Administered 2015-02-26 – 2015-02-28 (×3): 180 mg via ORAL
  Filled 2015-02-26 (×3): qty 1

## 2015-02-26 MED ORDER — POTASSIUM CHLORIDE CRYS ER 20 MEQ PO TBCR
40.0000 meq | EXTENDED_RELEASE_TABLET | Freq: Once | ORAL | Status: AC
  Administered 2015-02-26: 40 meq via ORAL
  Filled 2015-02-26: qty 2

## 2015-02-26 MED ORDER — LEVALBUTEROL HCL 1.25 MG/0.5ML IN NEBU: 1.2500 mg | INHALATION_SOLUTION | Freq: Four times a day (QID) | RESPIRATORY_TRACT | Status: DC | PRN

## 2015-02-26 MED ORDER — ALPRAZOLAM 0.5 MG PO TABS
0.5000 mg | ORAL_TABLET | Freq: Three times a day (TID) | ORAL | Status: DC | PRN
  Administered 2015-02-26 – 2015-02-28 (×4): 0.5 mg via ORAL
  Filled 2015-02-26 (×4): qty 1

## 2015-02-26 NOTE — Progress Notes (Signed)
Patient arrived on unit via wheelchair from [facility]  No family at bedside.  Telemetry placed per MD order & CMT notified.

## 2015-02-26 NOTE — Progress Notes (Signed)
Name: Shane Campos MRN: [id number] DOB: [birth year]    ADMISSION DATE:  02/21/2015 CONSULTATION DATE:  9/21  REFERRING MD :  [facility]  CHIEF COMPLAINT:  SOB, F/C/S  BRIEF PATIENT DESCRIPTION: Emaciated wm with months of purulent sputum and hemoptysis  SIGNIFICANT EVENTS    STUDIES:  9/223 Bronch with BAL and brush.   HISTORY OF PRESENT ILLNESS:  74 yo WM, life long 1 ppd smoker(quit 1 month ago due to sob/cough) history of skin cancer face with removal in past and with presumed reoccurrence left temporal area, who presents to [facility] ED with 3 months of cough, 2 months of purulent sputum, 2 weeks of hemoptysis plus fever , chills and sweats for 3 months. He has resisted family encouragement to seek medical care. CT of chest demonstrates 7.6x 6.7 mass/consolidation left lingular region with infringement of lul bronchus. He also has Afib with RVR along with hyponatremia and 30 lbs weight loss over several months. He is a poor historian and has somewhat of a dull affect(possible mets)PCCCM asked to consult for lul cavitary mass. When asked if he would want treatment for lung cancer the answer was "yes".  SUBJECTIVE:  'Fluttering' stopped Denies CP, dyspnea  VITAL SIGNS: Temp:  [97.5 F (36.4 C)-98.3 F (36.8 C)] 98.2 F (36.8 C) (09/25 0721) Pulse Rate:  [68-104] 83 (09/25 0721) Resp:  [18-27] 27 (09/25 0721) BP: (101-138)/(56-66) 125/62 mmHg (09/25 0700) SpO2:  [92 %-97 %] 97 % (09/25 0721) Weight:  [70.035 kg (154 lb 6.4 oz)-70.081 kg (154 lb 8 oz)] 70.081 kg (154 lb 8 oz) (09/25 0429)  PHYSICAL EXAMINATION: General: Emaciated white male with dull affect Neuro:  Dull affect, mae x 4 and follows commands HEENT:  No jvd/LAN Cardiovascular:  hsir ir a fib Lungs:  Coarse rhonchi left > right Abdomen:  Soft + bs, left inguinal herina Musculoskeletal:  intact Skin:  Warm and dry, left temporal  lesion and left ear lesion noted   Recent Labs Lab 02/24/15 0320 02/25/15 0250  02/26/15 0215  NA 127* 130* 129*  K 3.6 3.6 3.3*  CL 95* 97* 95*  CO2 '26 25 26  '$ BUN '12 13 14  '$ CREATININE 0.81 0.77 0.77  GLUCOSE 102* 113* 101*    Recent Labs Lab 02/21/15 2302 02/22/15 0551 02/25/15 0250  HGB 10.5* 10.0* 10.3*  HCT 31.2* 29.6* 30.8*  WBC 8.5 7.8 9.5  PLT 312 315 311   Dg Chest Port 1 View  02/26/2015   CLINICAL DATA:  Dyspnea.  EXAM: PORTABLE CHEST 1 VIEW  COMPARISON:  CT chest 02/21/2015.  Chest radiograph 02/21/2015.  FINDINGS: Left upper lung mass or consolidation with central cavitation. This could be due to neoplasm or necrotizing infection. TB or fungal infection not excluded. There is progression of infiltration now all seen in the left lower lung. Probable atelectasis in the left lung base behind the heart. Small left pleural effusion. Right lung is clear. Normal heart size and pulmonary vascularity.  IMPRESSION: Left upper lung mass or consolidation with central cavitation. Increasing infiltration now present in the left lung base. Small left pleural effusion.   Electronically Signed   By: Lucienne Capers M.D.   On: 02/26/2015 05:47   - reviewed chest CT myself, posterior segment of LUL cavitary lesion noted.   BAL afb neg Sputum MSSA  Discussion: 74 yo WM, life long 1 ppd smoker(quit 1 month ago due to sob/cough) history of skin cancer face with removal in past and with presumed reoccurrence left  temporal area, who presents to [facility] ED with 3 months of cough, 2 months of purulent sputum, 2 weeks of hemoptysis plus fever , chills and sweats for 3 months. He has resisted family encouragement to seek medical care. CT of chest demonstrates 7.6x 6.7 mass/consolidation left lingular region with infringement of lul bronchus. He also has Afib with RVR along with hyponatremia and 30 lbs weight loss over several months. Bronch -no endobronchial lesions This could simply be MSSA pneumonia with cavitation or with underlying cancer  ASSESSMENT: Principal Problem:    Cavitary lesion of lung, left, rule out TB(doubt)   CAP (community acquired pneumonia)   Atrial fibrillation with RVR   Hyponatremia   Anemia   Smoker   Recurrent skin cancer   PLAN: Can dc isolation Treat with ancef for MSSA pna Await cytology - will need radiographic FU , if no resolution in 4-6 wks, further imaging & diagnostics Consider skin bx with derm consult as outpatient.  Rigoberto Noel MD [phone]

## 2015-02-26 NOTE — Progress Notes (Signed)
Dr. Elsworth Soho stated pt no longer needed isolation precautions. Paged Dr. Broadus John to get order and to replete potassium of 3.3. Awaiting Dr. Broadus John response before transferring patient.    Shane Campos

## 2015-02-26 NOTE — Progress Notes (Addendum)
Patient ID: Shane Campos, male   DOB: [birth year], 74 y.o.   MRN: [id number]  [facility] PROGRESS NOTE  Shane Campos:[id number] DOB: 10/24/1940 DOA: 02/21/2015   PCP: Pt not sure of the name of PCP at this time, will ask case manager to check for pt    Brief narrative:    74 yo male with long history of 1 ppd cigarettes smoking, history of skin cancer face with removal in past and with presumed reoccurrence left temporal area, who presented to [facility] ED with main concern of 2 months duration of persistent productive cough of purulent sputum associated with fevers, chills, malaise, hemoptysis, night sweats, ~ 30 lbs weight loss. Initial work up included CT chest which was notable for 7.6 x 6.7 cm mass/consolidation left lingular region with infringement of LUL bronchus. In ED, pt was also noted to be in a-fib with RVR and with Na 124. TRH asked to admit to SDU and PCCM consulted for assistance with LUL cavitary mass.   Major events since admission: 9/23 - bronchoscopy by Dr. Nelda Marseille   Assessment/Plan:    Principal Problem:   Acute respiratory failure with hypoxia - secondary to Lung mass, Post obstructive PNA , COPD - appreciate PCCM input, - s/p bronchoscopy 9/23 - BAL fluid was sent for AFB cx with smear, gram stain, cytology, fungus cx with smear (9/23), results still pending - Sputum AFB negative 1, still awaiting the results on BAL - sputum Cx with MSSA, change to Ancef per Pulm recs,  was on Vanc and Fortaz for 3 days prior -Quant TB gold is negative  Active Problems:   Atrial fibrillation with RVR, CHADS 2 = 2 (age and HTN) - currently on metoprolol 25 mg PO BID , increase cardizem CD to '180mg'$  - rate is 90-100s - Cardiology input appreciated, patient is not a candidate for anticoagulation for his A. fib given copious amount of blood noted during bronch.  Cavitary lesion of lung, LUL, ? malignant process  - management as noted above - continue to follow up on  PCCM recommendations  - Awaiting BAL cytology    CAP (community acquired pneumonia), ? lobar PNA, RUL -Abx as above    Hyponatremia, ? SIADH - Na trending up from 124 --> 122 --> 127-->130 - This is most likely related to lung mass    Smoker - pt already quit about 1 month ago due to symptoms - encouraged     Anemia of chronic disease, ? IDA - no signs of bleeding, Hg stable ~ 10    Recurrent skin cancer - will need outpatient follow up     Moderate PCM - RD consulted     Hiccups - better with Thorazine   DVT prophylaxis - Lovenox SQ  Code Status: Full.  Family Communication:  plan of care discussed with the patient,none at bedside Disposition Plan: Home when stable.   IV access:  Peripheral IV  Procedures and diagnostic studies:    Bronchoscopy  02/24/2015  Ct Angio Chest Pe W/cm &/or Wo Cm 02/22/2015   No CT evidence of pulmonary embolism.  Left upper lobe/lingula cavitary mass with extension into the left hilum and mediastinum. Differential includes a cavitary neoplasm or an infectious process such as fungal infection, abscess, or TB. Other etiologies are not excluded. Correlation with sputum cultures and pulmonary consult is advised. Bronchoscopy may provide better evaluation if clinically indicated.  Focal right upper lobe tree-in-bud nodular appearance compatible with an infectious process.  Dg Chest Port 1 View 02/21/2015   Significant left upper lobe opacity, possibly an infectious process. Followup PA and lateral chest X-ray is recommended in 3-4 weeks following trial of antibiotic therapy to ensure resolution and exclude underlying malignancy.    Medical Consultants:  PCCM Cardiology   Other Consultants:  [place]   IAnti-Infectives:   Vancomycin 9/21 --> Tressie Ellis 9/21 -->  Domenic Polite, MD  [facility] Pager [phone]  If 7PM-7AM, please contact night-coverage [email or web address] Password [email or web address] 02/26/2015, 10:00 AM   LOS: 4 days    HPI/Subjective: No events overnight. Still with persistent cough intermittently  Objective: Filed Vitals:   02/26/15 0429 02/26/15 0700 02/26/15 0721 02/26/15 0800  BP:  125/62  117/65  Pulse:   83 106  Temp:   98.2 F (36.8 C)   TempSrc:   Oral   Resp:   27 19  Height:      Weight: 70.081 kg (154 lb 8 oz)     SpO2:   97% 99%    Intake/Output Summary (Last 24 hours) at 02/26/15 1000 Last data filed at 02/26/15 0800  Gross per 24 hour  Intake    950 ml  Output   1075 ml  Net   -125 ml    Exam:   General:  Pt is alert, NAD  Cardiovascular: Irregular rate and rhythm,  no rubs, no gallops  Respiratory: Rhonchi L > R side, diminished breath sounds at bases   Abdomen: Soft, non tender, non distended, bowel sounds present, no guarding  Extremities: pulses DP and PT palpable bilaterally  Neuro: Grossly nonfocal  Data Reviewed: Basic Metabolic Panel:  Recent Labs Lab 02/21/15 2302 02/22/15 0551 02/24/15 0320 02/25/15 0250 02/26/15 0215  NA 124* 122* 127* 130* 129*  K 3.9 3.7 3.6 3.6 3.3*  CL 90* 89* 95* 97* 95*  CO2 '25 23 26 25 26  '$ GLUCOSE 120* 104* 102* 113* 101*  BUN '15 13 12 13 14  '$ CREATININE 0.95 0.79 0.81 0.77 0.77  CALCIUM 8.3* 7.9* 8.0* 8.1* 8.3*  PHOS  --   --  2.9 2.9 2.3*   Liver Function Tests:  Recent Labs Lab 02/22/15 0551 02/24/15 0320 02/25/15 0250 02/26/15 0215  AST 57*  --   --   --   ALT 49  --   --   --   ALKPHOS 129*  --   --   --   BILITOT 1.4*  --   --   --   PROT 6.2*  --   --   --   ALBUMIN 1.9* 1.7* 1.7* 1.7*   CBC:  Recent Labs Lab 02/21/15 2302 02/22/15 0551 02/25/15 0250  WBC 8.5 7.8 9.5  NEUTROABS  --  4.6  --   HGB 10.5* 10.0* 10.3*  HCT 31.2* 29.6* 30.8*  MCV 89.9 88.9 90.6  PLT 312 315 311    Recent Results (from the past 240 hour(s))  Blood culture (routine x 2)     Status: None (Preliminary result)   Collection Time: 02/22/15 12:58 AM  Result Value Ref Range Status   Specimen Description BLOOD  LEFT ANTECUBITAL  Final   Special Requests BOTTLES DRAWN AEROBIC AND ANAEROBIC 5CC  Final   Culture NO GROWTH 3 DAYS  Final   Report Status PENDING  Incomplete  Blood culture (routine x 2)     Status: None (Preliminary result)   Collection Time: 02/22/15  1:05 AM  Result Value Ref Range Status   Specimen Description BLOOD LEFT HAND  Final   Special Requests BOTTLES DRAWN AEROBIC AND ANAEROBIC 5CC  Final   Culture NO GROWTH 3 DAYS  Final   Report Status PENDING  Incomplete  AFB culture with smear     Status: None (Preliminary result)   Collection Time: 02/22/15  7:00 AM  Result Value Ref Range Status   Specimen Description SPUTUM  Final   Special Requests NONE  Final   Acid Fast Smear   Final    NO ACID FAST BACILLI SEEN Performed at [facility]    Culture   Final    CULTURE WILL BE EXAMINED FOR 6 WEEKS BEFORE ISSUING A FINAL REPORT Performed at [facility]    Report Status PENDING  Incomplete  Gram stain     Status: None   Collection Time: 02/22/15  7:00 AM  Result Value Ref Range Status   Specimen Description SPUTUM  Final   Special Requests NONE  Final   Gram Stain   Final    MODERATE WBC PRESENT,BOTH PMN AND MONONUCLEAR ABUNDANT GRAM POSITIVE COCCI IN PAIRS FEW GRAM POSITIVE COCCI IN CHAINS FEW GRAM NEGATIVE RODS RARE GRAM POSITIVE RODS    Report Status 02/22/2015 FINAL  Final  Culture, sputum-assessment     Status: None   Collection Time: 02/22/15  7:46 AM  Result Value Ref Range Status   Specimen Description SPUTUM  Final   Special Requests NONE  Final   Sputum evaluation   Final    THIS SPECIMEN IS ACCEPTABLE. RESPIRATORY CULTURE REPORT TO FOLLOW.   Report Status 02/22/2015 FINAL  Final  Culture, respiratory (NON-Expectorated)     Status: None   Collection Time: 02/22/15  7:46 AM  Result Value Ref Range Status   Specimen Description SPUTUM  Final   Special Requests NONE  Final   Gram Stain   Final    MODERATE WBC PRESENT,BOTH PMN AND  MONONUCLEAR NO SQUAMOUS EPITHELIAL CELLS SEEN ABUNDANT GRAM POSITIVE COCCI IN PAIRS FEW GRAM NEGATIVE RODS RARE GRAM POSITIVE RODS    Culture   Final    ABUNDANT STAPHYLOCOCCUS AUREUS Note: RIFAMPIN AND GENTAMICIN SHOULD NOT BE USED AS SINGLE DRUGS FOR TREATMENT OF STAPH INFECTIONS. Performed at [facility]    Report Status 02/25/2015 FINAL  Final   Organism ID, Bacteria STAPHYLOCOCCUS AUREUS  Final      Susceptibility   Staphylococcus aureus - MIC*    CLINDAMYCIN <=0.25 SENSITIVE Sensitive     ERYTHROMYCIN <=0.25 SENSITIVE Sensitive     GENTAMICIN <=0.5 SENSITIVE Sensitive     LEVOFLOXACIN <=0.12 SENSITIVE Sensitive     OXACILLIN <=0.25 SENSITIVE Sensitive     RIFAMPIN <=0.5 SENSITIVE Sensitive     TRIMETH/SULFA <=10 SENSITIVE Sensitive     VANCOMYCIN 1 SENSITIVE Sensitive     TETRACYCLINE <=1 SENSITIVE Sensitive     MOXIFLOXACIN <=0.25 SENSITIVE Sensitive     * ABUNDANT STAPHYLOCOCCUS AUREUS  MRSA PCR Screening     Status: None   Collection Time: 02/22/15  2:30 PM  Result Value Ref Range Status   MRSA by PCR NEGATIVE NEGATIVE Final    Comment:        The GeneXpert MRSA Assay (FDA approved for NASAL specimens only), is one component of a comprehensive MRSA colonization surveillance program. It is not intended to diagnose MRSA infection nor to guide or monitor treatment for MRSA infections.   AFB culture with smear     Status: None (Preliminary result)   Collection Time: 02/23/15  5:30 AM  Result Value Ref Range Status   Specimen Description SPUTUM  Final   Special Requests NONE  Final   Acid Fast Smear   Final    NO ACID FAST BACILLI SEEN Performed at [facility]    Culture   Final    CULTURE WILL BE EXAMINED FOR 6 WEEKS BEFORE ISSUING A FINAL REPORT Performed at [facility]    Report Status PENDING  Incomplete  AFB culture with smear     Status: None (Preliminary result)   Collection Time: 02/24/15  5:35 AM  Result Value Ref Range  Status   Specimen Description SPUTUM  Final   Special Requests NONE  Final   Acid Fast Smear   Final    NO ACID FAST BACILLI SEEN Performed at [facility]    Culture   Final    CULTURE WILL BE EXAMINED FOR 6 WEEKS BEFORE ISSUING A FINAL REPORT Performed at [facility]    Report Status PENDING  Incomplete  AFB culture with smear     Status: None (Preliminary result)   Collection Time: 02/24/15  9:20 AM  Result Value Ref Range Status   Specimen Description BRONCHIAL ALVEOLAR LAVAGE  Final   Special Requests NONE  Final   Acid Fast Smear   Final    NO ACID FAST BACILLI SEEN Performed at [facility]    Culture   Final    CULTURE WILL BE EXAMINED FOR 6 WEEKS BEFORE ISSUING A FINAL REPORT Performed at [facility]    Report Status PENDING  Incomplete  Culture, bal-quantitative     Status: None (Preliminary result)   Collection Time: 02/24/15  9:20 AM  Result Value Ref Range Status   Specimen Description BRONCHIAL ALVEOLAR LAVAGE  Final   Special Requests NONE  Final   Gram Stain PENDING  Incomplete   Colony Count   Final    20,OOO COLONIES/ML Performed at [facility]    Culture   Final    Non-Pathogenic Oropharyngeal-type Flora Isolated. Performed at [facility]    Report Status PENDING  Incomplete  Fungus Culture with Smear     Status: None (Preliminary result)   Collection Time: 02/24/15  9:20 AM  Result Value Ref Range Status   Specimen Description BRONCHIAL ALVEOLAR LAVAGE  Final   Special Requests NONE  Final   Fungal Smear   Final    NO YEAST OR FUNGAL ELEMENTS SEEN Performed at [facility]    Culture   Final    CULTURE IN PROGRESS FOR FOUR WEEKS Performed at [facility]    Report Status PENDING  Incomplete     Scheduled Meds: . diltiazem  180 mg Oral Daily  . docusate sodium  100 mg Oral BID  . feeding supplement (ENSURE ENLIVE)  237 mL Oral BID BM  . levofloxacin (LEVAQUIN) IV  750 mg  Intravenous Q24H  . metoprolol tartrate  25 mg Oral BID  . pantoprazole  40 mg Oral Daily   Continuous Infusions:

## 2015-02-27 ENCOUNTER — Inpatient Hospital Stay (HOSPITAL_COMMUNITY): Payer: Commercial Managed Care - HMO

## 2015-02-27 ENCOUNTER — Telehealth: Payer: Self-pay | Admitting: Internal Medicine

## 2015-02-27 DIAGNOSIS — I4891 Unspecified atrial fibrillation: Secondary | ICD-10-CM

## 2015-02-27 DIAGNOSIS — J9601 Acute respiratory failure with hypoxia: Secondary | ICD-10-CM

## 2015-02-27 LAB — RENAL FUNCTION PANEL
Albumin: 1.6 g/dL — ABNORMAL LOW (ref 3.5–5.0)
Anion gap: 7 (ref 5–15)
BUN: 15 mg/dL (ref 6–20)
CO2: 28 mmol/L (ref 22–32)
Calcium: 8.3 mg/dL — ABNORMAL LOW (ref 8.9–10.3)
Chloride: 97 mmol/L — ABNORMAL LOW (ref 101–111)
Creatinine, Ser: 0.73 mg/dL (ref 0.61–1.24)
GFR calc Af Amer: >60 mL/min (ref >60)
GFR calc non Af Amer: >60 mL/min (ref >60)
Glucose, Bld: 89 mg/dL (ref 65–99)
Phosphorus: 2.4 mg/dL — ABNORMAL LOW (ref 2.5–4.6)
Potassium: 4.1 mmol/L (ref 3.5–5.1)
Sodium: 132 mmol/L — ABNORMAL LOW (ref 135–145)

## 2015-02-27 LAB — CULTURE, BLOOD (ROUTINE X 2)
CULTURE: NO GROWTH
CULTURE: NO GROWTH

## 2015-02-27 LAB — CULTURE, BAL-QUANTITATIVE: GRAM STAIN: NONE SEEN

## 2015-02-27 LAB — CBC
HCT: 29.5 % — ABNORMAL LOW (ref 39.0–52.0)
Hemoglobin: 9.9 g/dL — ABNORMAL LOW (ref 13.0–17.0)
MCH: 30.1 pg (ref 26.0–34.0)
MCHC: 33.6 g/dL (ref 30.0–36.0)
MCV: 89.7 fL (ref 78.0–100.0)
Platelets: 336 10*3/uL (ref 150–400)
RBC: 3.29 MIL/uL — ABNORMAL LOW (ref 4.22–5.81)
RDW: 13.9 % (ref 11.5–15.5)
WBC: 10.9 10*3/uL — ABNORMAL HIGH (ref 4.0–10.5)

## 2015-02-27 LAB — CULTURE, BAL-QUANTITATIVE W GRAM STAIN

## 2015-02-27 MED ORDER — POLYETHYLENE GLYCOL 3350 17 G PO PACK
17.0000 g | PACK | Freq: Every day | ORAL | Status: DC
  Administered 2015-02-27: 17 g via ORAL
  Filled 2015-02-27: qty 1

## 2015-02-27 MED ORDER — SENNOSIDES-DOCUSATE SODIUM 8.6-50 MG PO TABS
1.0000 | ORAL_TABLET | Freq: Two times a day (BID) | ORAL | Status: DC
Start: 2015-02-27 — End: 2015-02-28
  Administered 2015-02-27 – 2015-02-28 (×3): 1 via ORAL
  Filled 2015-02-27 (×3): qty 1

## 2015-02-27 MED ORDER — CEFAZOLIN SODIUM 1-5 GM-% IV SOLN
1.0000 g | Freq: Three times a day (TID) | INTRAVENOUS | Status: DC
  Administered 2015-02-27 – 2015-02-28 (×4): 1 g via INTRAVENOUS
  Filled 2015-02-27 (×6): qty 50

## 2015-02-27 MED ORDER — POLYETHYLENE GLYCOL 3350 17 G PO PACK
17.0000 g | PACK | Freq: Two times a day (BID) | ORAL | Status: DC
  Administered 2015-02-27 – 2015-02-28 (×3): 17 g via ORAL
  Filled 2015-02-27 (×2): qty 1

## 2015-02-27 NOTE — Evaluation (Signed)
Physical Therapy Evaluation Patient Details Name: Shane Campos MRN: [id number] DOB: March 12, 1941 Today's Date: 02/27/2015   History of Present Illness  74 yo male with long history of 1 ppd cigarettes smoking, history of skin cancer face with removal in past and with presumed reoccurrence left temporal area, who presented to [facility] ED with main concern of 2 months duration of persistent productive cough of purulent sputum associated with fevers, chills, malaise, hemoptysis, night sweats, ~ 30 lbs weight loss. Initial work up included CT chest which was notable for 7.6 x 6.7 cm mass/consolidation left lingular region with infringement of LUL bronchus. In ED, pt was also noted to be in a-fib with RVR and with Na 124. TRH asked to admit to SDU and PCCM consulted for assistance with LUL cavitary mass.  Clinical Impression   Pt admitted with above diagnosis. Pt currently with functional limitations due to the deficits listed below (see PT Problem List).  Pt will benefit from skilled PT to increase their independence and safety with mobility to allow discharge to the venue listed below.       Follow Up Recommendations No PT follow up    Equipment Recommendations  None recommended by PT    Recommendations for Other Services       Precautions / Restrictions Precautions Precautions: None      Mobility  Bed Mobility Overal bed mobility: Independent                Transfers Overall transfer level: Independent                  Ambulation/Gait Ambulation/Gait assistance: Min guard;Supervision Ambulation Distance (Feet): 200 Feet Assistive device: None Gait Pattern/deviations: Step-through pattern     General Gait Details: minguard assist progressing to supervision; Cues to self-monitor for activity tolerance; noted one occasion of loss of balance, with use of hallway rail to regain balance  Stairs            Wheelchair Mobility    Modified Rankin (Stroke Patients  Only)       Balance                                             Pertinent Vitals/Pain Pain Assessment: No/denies pain    Home Living Family/patient expects to be discharged to:: Private residence Living Arrangements: Children Available Help at Discharge: Family;Available PRN/intermittently (son works 8-5) Type of Home: House Home Access: Stairs to enter Entrance Stairs-Rails: Building surveyor of Steps: 5 Home Layout: One level Home Equipment: Cane - single point      Prior Function Level of Independence: Independent               Hand Dominance        Extremity/Trunk Assessment   Upper Extremity Assessment: Overall WFL for tasks assessed           Lower Extremity Assessment: Generalized weakness         Communication   Communication: Other (comment) (noted stutter-like speech)  Cognition Arousal/Alertness: Awake/alert Behavior During Therapy: WFL for tasks assessed/performed Overall Cognitive Status: Within Functional Limits for tasks assessed                      General Comments General comments (skin integrity, edema, etc.): Session conducted on Room Air, and noted O2 sats reamined greater than or equal  to 95%    Exercises        Assessment/Plan    PT Assessment Patient needs continued PT services  PT Diagnosis Generalized weakness   PT Problem List Decreased strength;Decreased activity tolerance;Decreased balance  PT Treatment Interventions DME instruction;Gait training;Stair training;Functional mobility training;Therapeutic activities;Therapeutic exercise;Balance training;Patient/family education   PT Goals (Current goals can be found in the Care Plan section) Acute Rehab PT Goals Patient Stated Goal: Very focused on moving his bowels PT Goal Formulation: With patient Time For Goal Achievement: 03/06/15 Potential to Achieve Goals: Good    Frequency Min 3X/week (Will likely meet PT goals  quickly)   Barriers to discharge        Co-evaluation               End of Session   Activity Tolerance: Patient tolerated treatment well Patient left: in bed;with call bell/phone within reach Nurse Communication: Mobility status         Time: 4268-3419 PT Time Calculation (min) (ACUTE ONLY): 16 min   Charges:   PT Evaluation $Initial PT Evaluation Tier I: 1 Procedure     PT G CodesRoney Marion Campos 02/27/2015, 4:07 PM  Shane Marion, [place] Pager [phone] Office [phone]

## 2015-02-27 NOTE — Progress Notes (Signed)
  Echocardiogram 2D Echocardiogram has been performed.  Diamond Nickel 02/27/2015, 9:32 AM

## 2015-02-27 NOTE — Progress Notes (Signed)
   Name: Shane Campos MRN: [id number] DOB: Jul 29, 1940    ADMISSION DATE:  02/21/2015 CONSULTATION DATE:  9/21  REFERRING MD :  [facility]  CHIEF COMPLAINT:  SOB, F/C/S  BRIEF PATIENT DESCRIPTION: Long term smoker 74 years of age was admitted on 9/20 emaciated wm with months of purulent sputum and hemoptysis.  Underwent bronchoscopy on 9/23  SIGNIFICANT EVENTS    STUDIES:  9/223 Bronch with BAL and brush.    SUBJECTIVE:  Just feels really tired  VITAL SIGNS: Temp:  [97.9 F (36.6 C)-98.6 F (37 C)] 98.3 F (36.8 C) (09/26 0439) Pulse Rate:  [72-89] 72 (09/26 0439) Resp:  [18-25] 19 (09/26 0439) BP: (101-125)/(61-73) 125/64 mmHg (09/26 0439) SpO2:  [91 %-97 %] 95 % (09/26 0439) Weight:  [153 lb 12.8 oz (69.763 kg)] 153 lb 12.8 oz (69.763 kg) (09/25 2011)  PHYSICAL EXAMINATION: General: cachectic, lying in bed HENT: several skin lesions neck and scalp PULM: crackles LUL, otherwise clear, normal effort CV: RRR, no mgr GI: BS+, soft, nontender MSK: normal bulk and tone   Recent Labs Lab 02/25/15 0250 02/26/15 0215 02/27/15 0344  NA 130* 129* 132*  K 3.6 3.3* 4.1  CL 97* 95* 97*  CO2 '25 26 28  '$ BUN '13 14 15  '$ CREATININE 0.77 0.77 0.73  GLUCOSE 113* 101* 89    Recent Labs Lab 02/22/15 0551 02/25/15 0250 02/27/15 0344  HGB 10.0* 10.3* 9.9*  HCT 29.6* 30.8* 29.5*  WBC 7.8 9.5 10.9*  PLT 315 311 336    9/26 CT images personally reviewed showing a large left upper lobe thick walled cavity with surrounding ground glass and consolidation   BAL afb neg Sputum MSSA  Discussion: This entire picture could be consistent with staph pneumonia. In historical case reports staph aureus is the has been named the most common cause of cavitary lung lesions.  That said, he still is at increased risk for malignancy, so he will need close follow up even if his pathology report (cytology report) is negative.  ASSESSMENT: Principal Problem:   Cavitary lesion of lung> due  to MSSA pneumonia   Atrial fibrillation with RVR > rate controlled on my exam   Hyponatremia > improving   Anemia without evidence of bleeding   Smoker   Recurrent skin cancer   PLAN: Treat with ancef for MSSA pna > would treat for 4 weeks oral therapy (keflex) minimum with a probiotic to Minimize GI side effects Will need f/u imaging in 6-8 week Will have my office arrange a f/u appointment with either me or Dr. Elsworth Soho, whichever is first Needs skin bx with derm consult as outpatient.  PCCM will sign off  MCQUAID, DOUGLAS  Roselie Awkward, MD [facility] Pager: [phone] Cell: [phone] After 3pm or if no response, call [phone]

## 2015-02-27 NOTE — Progress Notes (Addendum)
Patient ID: Shane Campos, male   DOB: 03/06/1941, 74 y.o.   MRN: [id number]  [facility] PROGRESS NOTE  Shane Campos BHA:[id number] DOB: 08-May-1941 DOA: 02/21/2015   PCP:   Brief narrative:    74 yo male with long history of 1 ppd cigarettes smoking, history of skin cancer face with removal in past and with presumed reoccurrence left temporal area, who presented to [facility] ED with main concern of 2 months duration of persistent productive cough of purulent sputum associated with fevers, chills, malaise, hemoptysis, night sweats, ~ 30 lbs weight loss. Initial work up included CT chest which was notable for 7.6 x 6.7 cm mass/consolidation left lingular region with infringement of LUL bronchus. In ED, pt was also noted to be in a-fib with RVR and with Na 124. TRH asked to admit to SDU and PCCM consulted for assistance with LUL cavitary mass.   Major events since admission: 9/23 - bronchoscopy by Dr. Nelda Marseille   Assessment/Plan:    Principal Problem:   Acute respiratory failure with hypoxia - secondary to Cavitary Lung mass/PNA , COPD - appreciate PCCM input, - s/p bronchoscopy 9/23 - BAL fluid was sent for AFB cx with smear, gram stain, cultures negative thus far, Cytology pending - Sputum AFB negative 1, - sputum Cx with MSSA, changed to Ancef per Pulm recs,  was on Vanc and Fortaz for 3 days prior -Quant TB gold is negative -If Cytology negative, DC home on Keflex for 4 weeks for FU with Dr.McQuaid for repeat CT and possible repeat bronch down the road  Active Problems:   Atrial fibrillation with RVR, CHADS 2 = 2 (age and HTN) - in NSR now, HR improved, continue metoprolol and Cardizem CD - Cardiology input appreciated, patient is not a candidate for anticoagulation for his A. fib given copious amount of blood noted during bronch.  Cavitary lesion of lung, LUL -as above    CAP (community acquired pneumonia), ? lobar PNA, RUL -Abx as above    Hyponatremia, ? SIADH - Na  trending up from 124 --> 122 --> 127-->130->132 - This is most likely related to lung mass -improved   Constipation -add miralax, senokot BID    Smoker - pt already quit about 1 month ago due to symptoms - encouraged     Anemia of chronic disease, ? IDA - no signs of bleeding, Hg stable ~ 10    Recurrent skin cancer - will need outpatient follow up     Moderate PCM - RD consulted     Hiccups - better with Thorazine   DVT prophylaxis - Lovenox SQ  Ambulate, Pt eval  Code Status: Full.  Family Communication:  plan of care discussed with the patient,none at bedside Disposition Plan: Home when stable.   IV access:  Peripheral IV  Procedures and diagnostic studies:    Bronchoscopy  02/24/2015  Ct Angio Chest Pe W/cm &/or Wo Cm 02/22/2015   No CT evidence of pulmonary embolism.  Left upper lobe/lingula cavitary mass with extension into the left hilum and mediastinum. Differential includes a cavitary neoplasm or an infectious process such as fungal infection, abscess, or TB. Other etiologies are not excluded. Correlation with sputum cultures and pulmonary consult is advised. Bronchoscopy may provide better evaluation if clinically indicated.  Focal right upper lobe tree-in-bud nodular appearance compatible with an infectious process.    Dg Chest Port 1 View 02/21/2015   Significant left upper lobe opacity, possibly an infectious process. Followup PA and lateral chest  X-ray is recommended in 3-4 weeks following trial of antibiotic therapy to ensure resolution and exclude underlying malignancy.    Medical Consultants:  PCCM Cardiology   Other Consultants:  [place]   IAnti-Infectives:   Vancomycin 9/21 --> Tressie Ellis 9/21 -->  Domenic Polite, MD  [facility] Pager [phone]  If 7PM-7AM, please contact night-coverage [email or web address] Password [facility] 02/27/2015, 11:16 AM   LOS: 5 days   HPI/Subjective: Constipated, anxious, breathing ok  Objective: Filed Vitals:    02/26/15 1200 02/26/15 1451 02/26/15 2011 02/27/15 0439  BP: 101/61 120/73 124/63 125/64  Pulse: 89 84 80 72  Temp:  98.6 F (37 C) 98 F (36.7 C) 98.3 F (36.8 C)  TempSrc:  Oral Oral Oral  Resp: '18 18 18 19  '$ Height:      Weight:   69.763 kg (153 lb 12.8 oz)   SpO2: 91% 96% 97% 95%    Intake/Output Summary (Last 24 hours) at 02/27/15 1116 Last data filed at 02/27/15 0558  Gross per 24 hour  Intake    720 ml  Output    200 ml  Net    520 ml    Exam:   General:  Pt is alert, NAD  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  Cardiovascular:S1S2/RRR,  no rubs, no gallops  Respiratory: Rhonchi L > R side, diminished breath sounds at bases   Abdomen: Soft, non tender, non distended, bowel sounds present, no guarding  Extremities: pulses DP and PT palpable bilaterally  Neuro: Grossly nonfocal  Data Reviewed: Basic Metabolic Panel:  Recent Labs Lab 02/22/15 0551 02/24/15 0320 02/25/15 0250 02/26/15 0215 02/27/15 0344  NA 122* 127* 130* 129* 132*  K 3.7 3.6 3.6 3.3* 4.1  CL 89* 95* 97* 95* 97*  CO2 '23 26 25 26 28  '$ GLUCOSE 104* 102* 113* 101* 89  BUN '13 12 13 14 15  '$ CREATININE 0.79 0.81 0.77 0.77 0.73  CALCIUM 7.9* 8.0* 8.1* 8.3* 8.3*  PHOS  --  2.9 2.9 2.3* 2.4*   Liver Function Tests:  Recent Labs Lab 02/22/15 0551 02/24/15 0320 02/25/15 0250 02/26/15 0215 02/27/15 0344  AST 57*  --   --   --   --   ALT 49  --   --   --   --   ALKPHOS 129*  --   --   --   --   BILITOT 1.4*  --   --   --   --   PROT 6.2*  --   --   --   --   ALBUMIN 1.9* 1.7* 1.7* 1.7* 1.6*   CBC:  Recent Labs Lab 02/21/15 2302 02/22/15 0551 02/25/15 0250 02/27/15 0344  WBC 8.5 7.8 9.5 10.9*  NEUTROABS  --  4.6  --   --   HGB 10.5* 10.0* 10.3* 9.9*  HCT 31.2* 29.6* 30.8* 29.5*  MCV 89.9 88.9 90.6 89.7  PLT 312 315 311 336    Recent Results (from the past 240 hour(s))  Blood culture (routine x 2)     Status: None (Preliminary result)   Collection Time: 02/22/15 12:58 AM  Result  Value Ref Range Status   Specimen Description BLOOD LEFT ANTECUBITAL  Final   Special Requests BOTTLES DRAWN AEROBIC AND ANAEROBIC 5CC  Final   Culture NO GROWTH 4 DAYS  Final   Report Status PENDING  Incomplete  Blood culture (routine x 2)     Status: None (Preliminary result)   Collection Time: 02/22/15  1:05 AM  Result  Value Ref Range Status   Specimen Description BLOOD LEFT HAND  Final   Special Requests BOTTLES DRAWN AEROBIC AND ANAEROBIC 5CC  Final   Culture NO GROWTH 4 DAYS  Final   Report Status PENDING  Incomplete  AFB culture with smear     Status: None (Preliminary result)   Collection Time: 02/22/15  7:00 AM  Result Value Ref Range Status   Specimen Description SPUTUM  Final   Special Requests NONE  Final   Acid Fast Smear   Final    NO ACID FAST BACILLI SEEN Performed at [facility]    Culture   Final    CULTURE WILL BE EXAMINED FOR 6 WEEKS BEFORE ISSUING A FINAL REPORT Performed at [facility]    Report Status PENDING  Incomplete  Gram stain     Status: None   Collection Time: 02/22/15  7:00 AM  Result Value Ref Range Status   Specimen Description SPUTUM  Final   Special Requests NONE  Final   Gram Stain   Final    MODERATE WBC PRESENT,BOTH PMN AND MONONUCLEAR ABUNDANT GRAM POSITIVE COCCI IN PAIRS FEW GRAM POSITIVE COCCI IN CHAINS FEW GRAM NEGATIVE RODS RARE GRAM POSITIVE RODS    Report Status 02/22/2015 FINAL  Final  Culture, sputum-assessment     Status: None   Collection Time: 02/22/15  7:46 AM  Result Value Ref Range Status   Specimen Description SPUTUM  Final   Special Requests NONE  Final   Sputum evaluation   Final    THIS SPECIMEN IS ACCEPTABLE. RESPIRATORY CULTURE REPORT TO FOLLOW.   Report Status 02/22/2015 FINAL  Final  Culture, respiratory (NON-Expectorated)     Status: None   Collection Time: 02/22/15  7:46 AM  Result Value Ref Range Status   Specimen Description SPUTUM  Final   Special Requests NONE  Final   Gram Stain    Final    MODERATE WBC PRESENT,BOTH PMN AND MONONUCLEAR NO SQUAMOUS EPITHELIAL CELLS SEEN ABUNDANT GRAM POSITIVE COCCI IN PAIRS FEW GRAM NEGATIVE RODS RARE GRAM POSITIVE RODS    Culture   Final    ABUNDANT STAPHYLOCOCCUS AUREUS Note: RIFAMPIN AND GENTAMICIN SHOULD NOT BE USED AS SINGLE DRUGS FOR TREATMENT OF STAPH INFECTIONS. Performed at [facility]    Report Status 02/25/2015 FINAL  Final   Organism ID, Bacteria STAPHYLOCOCCUS AUREUS  Final      Susceptibility   Staphylococcus aureus - MIC*    CLINDAMYCIN <=0.25 SENSITIVE Sensitive     ERYTHROMYCIN <=0.25 SENSITIVE Sensitive     GENTAMICIN <=0.5 SENSITIVE Sensitive     LEVOFLOXACIN <=0.12 SENSITIVE Sensitive     OXACILLIN <=0.25 SENSITIVE Sensitive     RIFAMPIN <=0.5 SENSITIVE Sensitive     TRIMETH/SULFA <=10 SENSITIVE Sensitive     VANCOMYCIN 1 SENSITIVE Sensitive     TETRACYCLINE <=1 SENSITIVE Sensitive     MOXIFLOXACIN <=0.25 SENSITIVE Sensitive     * ABUNDANT STAPHYLOCOCCUS AUREUS  MRSA PCR Screening     Status: None   Collection Time: 02/22/15  2:30 PM  Result Value Ref Range Status   MRSA by PCR NEGATIVE NEGATIVE Final    Comment:        The GeneXpert MRSA Assay (FDA approved for NASAL specimens only), is one component of a comprehensive MRSA colonization surveillance program. It is not intended to diagnose MRSA infection nor to guide or monitor treatment for MRSA infections.   AFB culture with smear     Status:  None (Preliminary result)   Collection Time: 02/23/15  5:30 AM  Result Value Ref Range Status   Specimen Description SPUTUM  Final   Special Requests NONE  Final   Acid Fast Smear   Final    NO ACID FAST BACILLI SEEN Performed at [facility]    Culture   Final    CULTURE WILL BE EXAMINED FOR 6 WEEKS BEFORE ISSUING A FINAL REPORT Performed at [facility]    Report Status PENDING  Incomplete  AFB culture with smear     Status: None (Preliminary result)   Collection  Time: 02/24/15  5:35 AM  Result Value Ref Range Status   Specimen Description SPUTUM  Final   Special Requests NONE  Final   Acid Fast Smear   Final    NO ACID FAST BACILLI SEEN Performed at [facility]    Culture   Final    CULTURE WILL BE EXAMINED FOR 6 WEEKS BEFORE ISSUING A FINAL REPORT Performed at [facility]    Report Status PENDING  Incomplete  AFB culture with smear     Status: None (Preliminary result)   Collection Time: 02/24/15  9:20 AM  Result Value Ref Range Status   Specimen Description BRONCHIAL ALVEOLAR LAVAGE  Final   Special Requests NONE  Final   Acid Fast Smear   Final    NO ACID FAST BACILLI SEEN Performed at [facility]    Culture   Final    CULTURE WILL BE EXAMINED FOR 6 WEEKS BEFORE ISSUING A FINAL REPORT Performed at [facility]    Report Status PENDING  Incomplete  Culture, bal-quantitative     Status: None   Collection Time: 02/24/15  9:20 AM  Result Value Ref Range Status   Specimen Description BRONCHIAL ALVEOLAR LAVAGE  Final   Special Requests NONE  Final   Gram Stain   Final    NO WBC SEEN NO SQUAMOUS EPITHELIAL CELLS SEEN NO ORGANISMS SEEN Performed at [facility]   Final    20,OOO COLONIES/ML Performed at [facility]    Culture   Final    Non-Pathogenic Oropharyngeal-type Flora Isolated. Performed at [facility]    Report Status 02/27/2015 FINAL  Final  Fungus Culture with Smear     Status: None (Preliminary result)   Collection Time: 02/24/15  9:20 AM  Result Value Ref Range Status   Specimen Description BRONCHIAL ALVEOLAR LAVAGE  Final   Special Requests NONE  Final   Fungal Smear   Final    NO YEAST OR FUNGAL ELEMENTS SEEN Performed at [facility]    Culture   Final    CULTURE IN PROGRESS FOR FOUR WEEKS Performed at [facility]    Report Status PENDING  Incomplete     Scheduled Meds: .  ceFAZolin (ANCEF) IV  1 g  Intravenous 3 times per day  . diltiazem  180 mg Oral Daily  . feeding supplement (ENSURE ENLIVE)  237 mL Oral BID BM  . metoprolol tartrate  25 mg Oral BID  . pantoprazole  40 mg Oral Daily  . polyethylene glycol  17 g Oral BID  . senna-docusate  1 tablet Oral BID   Continuous Infusions:

## 2015-02-27 NOTE — Care Management Important Message (Signed)
Important Message  Patient Details  Name: Shane Campos MRN: [id number] Date of Birth: 20-Jun-1940   Medicare Important Message Given:  Yes-second notification given    Delorse Lek 02/27/2015, 3:33 PM

## 2015-02-27 NOTE — Telephone Encounter (Signed)
Bone marrow biopsy results reviewed

## 2015-02-28 ENCOUNTER — Encounter (HOSPITAL_COMMUNITY): Payer: Self-pay | Admitting: Pulmonary Disease

## 2015-02-28 ENCOUNTER — Telehealth: Payer: Self-pay | Admitting: Internal Medicine

## 2015-02-28 ENCOUNTER — Telehealth: Payer: Self-pay

## 2015-02-28 DIAGNOSIS — C3490 Malignant neoplasm of unspecified part of unspecified bronchus or lung: Secondary | ICD-10-CM | POA: Diagnosis present

## 2015-02-28 LAB — RENAL FUNCTION PANEL
Albumin: 1.6 g/dL — ABNORMAL LOW (ref 3.5–5.0)
Anion gap: 7 (ref 5–15)
BUN: 14 mg/dL (ref 6–20)
CHLORIDE: 93 mmol/L — AB (ref 101–111)
CO2: 29 mmol/L (ref 22–32)
CREATININE: 0.79 mg/dL (ref 0.61–1.24)
Calcium: 8.2 mg/dL — ABNORMAL LOW (ref 8.9–10.3)
GFR calc non Af Amer: >60 mL/min (ref >60)
Glucose, Bld: 108 mg/dL — ABNORMAL HIGH (ref 65–99)
POTASSIUM: 3.8 mmol/L (ref 3.5–5.1)
Phosphorus: 3.3 mg/dL (ref 2.5–4.6)
Sodium: 129 mmol/L — ABNORMAL LOW (ref 135–145)

## 2015-02-28 LAB — CBC
HEMATOCRIT: 30 % — AB (ref 39.0–52.0)
HEMOGLOBIN: 10.2 g/dL — AB (ref 13.0–17.0)
MCH: 30.9 pg (ref 26.0–34.0)
MCHC: 34 g/dL (ref 30.0–36.0)
MCV: 90.9 fL (ref 78.0–100.0)
PLATELETS: 332 10*3/uL (ref 150–400)
RBC: 3.3 MIL/uL — AB (ref 4.22–5.81)
RDW: 14.2 % (ref 11.5–15.5)
WBC: 11.5 10*3/uL — ABNORMAL HIGH (ref 4.0–10.5)

## 2015-02-28 MED ORDER — SACCHAROMYCES BOULARDII 250 MG PO CAPS: 250.0000 mg | ORAL_CAPSULE | Freq: Two times a day (BID) | ORAL | Status: AC

## 2015-02-28 MED ORDER — CEPHALEXIN 500 MG PO CAPS: 500.0000 mg | ORAL_CAPSULE | Freq: Four times a day (QID) | ORAL | Status: AC

## 2015-02-28 MED ORDER — DILTIAZEM HCL ER COATED BEADS 180 MG PO CP24: 180.0000 mg | ORAL_CAPSULE | Freq: Every day | ORAL | Status: AC

## 2015-02-28 MED ORDER — POLYETHYLENE GLYCOL 3350 17 G PO PACK: 17.0000 g | PACK | Freq: Every day | ORAL | Status: AC | PRN

## 2015-02-28 MED ORDER — METOPROLOL TARTRATE 25 MG PO TABS: 25.0000 mg | ORAL_TABLET | Freq: Two times a day (BID) | ORAL | Status: AC

## 2015-02-28 MED ORDER — PANTOPRAZOLE SODIUM 40 MG PO TBEC: 40.0000 mg | DELAYED_RELEASE_TABLET | Freq: Every day | ORAL | Status: AC

## 2015-02-28 MED ORDER — ALBUTEROL SULFATE HFA 108 (90 BASE) MCG/ACT IN AERS: 2.0000 | INHALATION_SPRAY | Freq: Four times a day (QID) | RESPIRATORY_TRACT | Status: AC | PRN

## 2015-02-28 NOTE — Telephone Encounter (Signed)
Pt already has HFU scheduled.  Nothing further needed.

## 2015-02-28 NOTE — Care Management Note (Signed)
Case Management Note  Patient Details  Name: Shane Campos MRN: [id number] Date of Birth: 1940-06-09  Subjective/Objective:         CM followed this pt for progression and d/c planning.           Action/Plan:  02/28/2015 No HH or DME  Needs identified, pt d/c to home with son and will followup with oncology.  Expected Discharge Date:       02/28/2015           Expected Discharge Plan:  Home/Self Care  In-House Referral:  NA  Discharge planning Services  NA  Post Acute Care Choice:  NA Choice offered to:  NA  DME Arranged:    DME Agency:     HH Arranged:    HH Agency:     Status of Service:  Completed, signed off  Medicare Important Message Given:  Yes-second notification given Date Medicare IM Given:    Medicare IM give by:    Date Additional Medicare IM Given:    Additional Medicare Important Message give by:     If discussed at [place] of Stay Meetings, dates discussed:    Additional Comments:  Adron Bene, RN 02/28/2015, 2:30 PM

## 2015-02-28 NOTE — Progress Notes (Signed)
Patient discharge teaching given, including activity, diet, follow-up appoints, and medications. Patient verbalized understanding of all discharge instructions. IV access was d/c'd. Vitals are stable. Skin is intact except as charted in most recent assessments. Pt to be escorted out by NT, to be driven home by family.  Andrew Brake, MBA, BS, RN 

## 2015-02-28 NOTE — Discharge Summary (Signed)
Physician Discharge Summary  Shane Campos OXB:[id number] DOB: 1940-10-10 DOA: 02/21/2015  PCP: No primary care provider on file.  Admit date: 02/21/2015 Discharge date: 02/28/2015  Time spent: 45 minutes  Recommendations for Outpatient Follow-up:  1. Dr.Mohamed 10/4 at 11:30am, for new diagnosis fo Squamous cell CA of Lung 2. Dr.McQuaid in 2-3weeks, needs repeat Imaging of Cavitary pneumonia/mass 3. Outpatient Dermatology for Possible Basal Cell CA of R neck  Discharge Diagnoses:  Principal Problem:   Acute respiratory failure with hypoxia   Squamous cell carcinoma lung   MSSA Pneumonia   Atrial fibrillation with RVR   Hyponatremia   Smoker   Recurrent skin cancer   Hiccup   Anemia of chronic disease   Right upper lobe pneumonia   Cavitating mass of upper lobe of left lung   Moderate malnutrition   Cavitary lesion of lung  Discharge Condition: stable  Diet recommendation: heart healthy  Filed Weights   02/26/15 0429 02/26/15 2011 02/27/15 2127  Weight: 70.081 kg (154 lb 8 oz) 69.763 kg (153 lb 12.8 oz) 70.8 kg (156 lb 1.4 oz)    History of present illness:  Chief Complaint: Shortness of breath and cough  HPI: Shane Campos is a 74 y.o. male with Past medical history of smoking. Patient presents with complaints of shortness of breath and cough ongoing for last 3 months progressively worsening. He also had some night sweats as well as diaphoresis. He has low-grade fever throughout the day. This is on ongoing since last one month. Next and he has also significant weight loss of 30 pounds over last X months. He denies any lumps or bumps  Hospital Course:  Acute respiratory failure with hypoxia - secondary to Cavitary Lung mass due to Lung CA and MSSA pneumonia -also has underlying COPD - Seen by Pulm in consultation, s/p bronchoscopy 9/23 - BAL fluid was sent for AFB cx with smear, gram stain, cultures negative thus far, Cytology positive for Squamous cell  Carcinoma - Sputum AFB negative 1 - sputum Cx grew MSSA, Was initially on Iv Vanc and Zosyn, this was changed to IV Ancef and he will be discharged home on 4 weeks of Keflex per Pulmoanry recommendations, Pulmonary will FU with him and repeat imaging in 4-6weeks     Squamous cell CA of Lung -made FU with Dr.Mohamed for next week 10/4   Atrial fibrillation with RVR, CHADS Vasc = 2 (age and HTN) - in NSR now, HR improved, continue metoprolol and Cardizem CD - Cardiology was consulted and input appreciated, patient is not a candidate for anticoagulation for his A. fib given copious amount of blood noted during bronchoscopy   MSSA Pneumonia RUL -Abx as above   Hyponatremia, ? SIADH - Na trending up from 124 --> 122 --> 127-->130->132 - This is most likely related to lung mass -improved  Constipation -improved with miralax, senokot BID   Smoker - pt already quit about 1 month ago due to symptoms - encouraged    Anemia of chronic disease, ? IDA - no signs of bleeding, Hg stable ~ 10   Recurrent skin cancer - will need outpatient follow up   Moderate PCM - RD consulted    Procedures:  Bronchoscopy  Consultations:  Pulm  Discharge Exam: Filed Vitals:   02/28/15 0852  BP: 113/79  Pulse: 76  Temp: 97.5 F (36.4 C)  Resp: 18    General: AAOx3 Cardiovascular: S1S2/RRR Respiratory: scattered ronchi  Discharge Instructions   Discharge Instructions  Diet - low sodium heart healthy    Complete by:  As directed      Increase activity slowly    Complete by:  As directed           Current Discharge Medication List    START taking these medications   Details  albuterol (PROVENTIL HFA;VENTOLIN HFA) 108 (90 BASE) MCG/ACT inhaler Inhale 2 puffs into the lungs every 6 (six) hours as needed for wheezing or shortness of breath. Qty: 1 Inhaler, Refills: 2    cephALEXin (KEFLEX) 500 MG capsule Take 1 capsule (500 mg total) by mouth 4 (four) times daily. For  4 weeks Qty: 112 capsule, Refills: 0    diltiazem (CARDIZEM CD) 180 MG 24 hr capsule Take 1 capsule (180 mg total) by mouth daily. Qty: 30 capsule, Refills: 0    metoprolol tartrate (LOPRESSOR) 25 MG tablet Take 1 tablet (25 mg total) by mouth 2 (two) times daily. Qty: 60 tablet, Refills: 0    pantoprazole (PROTONIX) 40 MG tablet Take 1 tablet (40 mg total) by mouth daily. Qty: 30 tablet, Refills: 0    polyethylene glycol (MIRALAX / GLYCOLAX) packet Take 17 g by mouth daily as needed. Qty: 14 each, Refills: 0    saccharomyces boulardii (FLORASTOR) 250 MG capsule Take 1 capsule (250 mg total) by mouth 2 (two) times daily. For 4 weeks Qty: 54 capsule, Refills: 0      STOP taking these medications     Pseudoephedrine-Ibuprofen (ADVIL COLD & SINUS LIQUI-GELS) 30-200 MG CAPS        No Known Allergies Follow-up Information    Follow up with Eilleen Kempf., MD On 03/07/2015.   Specialty:  Oncology   Why:  at 11:30am   Contact information:   [place] [address] [phone]       Follow up with Simonne Maffucci, MD. Schedule an appointment as soon as possible for a visit on 03/30/2015.   Specialty:  Pulmonary Disease   Why:  APPOINTMENT: Thursday, 03/30/15 @ 10:15am   Contact information:   [place] [address] [phone]        The results of significant diagnostics from this hospitalization (including imaging, microbiology, ancillary and laboratory) are listed below for reference.    Significant Diagnostic Studies: Ct Angio Chest Pe W/cm &/or Wo Cm  02/22/2015   CLINICAL DATA:  74 year old male with shortness of breath under chest pain and hemoptysis.  EXAM: CT ANGIOGRAPHY CHEST WITH CONTRAST  TECHNIQUE: Multidetector CT imaging of the chest was performed using the standard protocol during bolus administration of intravenous contrast. Multiplanar CT image reconstructions and MIPs were obtained to evaluate the vascular anatomy.  CONTRAST:   75m OMNIPAQUE IOHEXOL 350 MG/ML SOLN  COMPARISON:  Radiograph dated 09/16  FINDINGS: There is a 7.6 x 6.7 cm masslike consolidation with central cavitation in the left infrahilar/ lingula region. The mass extends from the pleural surface into the left hilum. There is encasement and narrowing of the left upper lobe bronchus as well as encasement of the left upper lobe pulmonary artery branch. There is diffuse opacification of the left upper lobe with air bronchograms. There is a small left pleural effusion.  There is a focal area of nodularity with "Tree in bud appearance" in the right upper lobe (series 6, image 43 -45) the remainder of the lungs are clear. There is no pneumothorax.  The thoracic aorta appears unremarkable. There is no CT evidence of pulmonary artery embolism. Top-normal subcarinal  lymph nodes. There is no cardiomegaly or pericardial effusion. The thyroid gland and esophagus appear grossly unremarkable.  There is no axillary adenopathy. The chest wall soft tissues appear unremarkable. The osseous structures appear intact. There is no evidence of bony erosion or acute fracture. Mild degenerative changes of the spine.  Review of the MIP images confirms the above findings.  IMPRESSION: No CT evidence of pulmonary embolism.  Left upper lobe/lingula cavitary mass with extension into the left hilum and mediastinum. Differential includes a cavitary neoplasm or an infectious process such as fungal infection, abscess, or TB. Other etiologies are not excluded. Correlation with sputum cultures and pulmonary consult is advised. Bronchoscopy may provide better evaluation if clinically indicated.  Focal right upper lobe tree-in-bud nodular appearance compatible with an infectious process.  Small left pleural effusion.   Electronically Signed   By: Anner Crete M.D.   On: 02/22/2015 00:29   Dg Chest Port 1 View  02/26/2015   CLINICAL DATA:  Dyspnea.  EXAM: PORTABLE CHEST 1 VIEW  COMPARISON:  CT chest  02/21/2015.  Chest radiograph 02/21/2015.  FINDINGS: Left upper lung mass or consolidation with central cavitation. This could be due to neoplasm or necrotizing infection. TB or fungal infection not excluded. There is progression of infiltration now all seen in the left lower lung. Probable atelectasis in the left lung base behind the heart. Small left pleural effusion. Right lung is clear. Normal heart size and pulmonary vascularity.  IMPRESSION: Left upper lung mass or consolidation with central cavitation. Increasing infiltration now present in the left lung base. Small left pleural effusion.   Electronically Signed   By: Lucienne Capers M.D.   On: 02/26/2015 05:47   Dg Chest Port 1 View  02/21/2015   CLINICAL DATA:  Dyspnea and tachycardia tonight  EXAM: PORTABLE CHEST - 1 VIEW  COMPARISON:  02/10/2005  FINDINGS: Lungs are mildly hyperinflated. Heart size is normal. There is dense opacity within the left upper lobe, possibly infectious. Right lung is clear. No pulmonary edema.  IMPRESSION: Significant left upper lobe opacity, possibly an infectious process. Followup PA and lateral chest X-ray is recommended in 3-4 weeks following trial of antibiotic therapy to ensure resolution and exclude underlying malignancy.   Electronically Signed   By: Nolon Nations M.D.   On: 02/21/2015 23:14    Microbiology: Recent Results (from the past 240 hour(s))  Blood culture (routine x 2)     Status: None   Collection Time: 02/22/15 12:58 AM  Result Value Ref Range Status   Specimen Description BLOOD LEFT ANTECUBITAL  Final   Special Requests BOTTLES DRAWN AEROBIC AND ANAEROBIC 5CC  Final   Culture NO GROWTH 5 DAYS  Final   Report Status 02/27/2015 FINAL  Final  Blood culture (routine x 2)     Status: None   Collection Time: 02/22/15  1:05 AM  Result Value Ref Range Status   Specimen Description BLOOD LEFT HAND  Final   Special Requests BOTTLES DRAWN AEROBIC AND ANAEROBIC 5CC  Final   Culture NO GROWTH 5  DAYS  Final   Report Status 02/27/2015 FINAL  Final  AFB culture with smear     Status: None (Preliminary result)   Collection Time: 02/22/15  7:00 AM  Result Value Ref Range Status   Specimen Description SPUTUM  Final   Special Requests NONE  Final   Acid Fast Smear   Final    NO ACID FAST BACILLI SEEN Performed at [facility]  Culture   Final    CULTURE WILL BE EXAMINED FOR 6 WEEKS BEFORE ISSUING A FINAL REPORT Performed at [facility]    Report Status PENDING  Incomplete  Gram stain     Status: None   Collection Time: 02/22/15  7:00 AM  Result Value Ref Range Status   Specimen Description SPUTUM  Final   Special Requests NONE  Final   Gram Stain   Final    MODERATE WBC PRESENT,BOTH PMN AND MONONUCLEAR ABUNDANT GRAM POSITIVE COCCI IN PAIRS FEW GRAM POSITIVE COCCI IN CHAINS FEW GRAM NEGATIVE RODS RARE GRAM POSITIVE RODS    Report Status 02/22/2015 FINAL  Final  Culture, sputum-assessment     Status: None   Collection Time: 02/22/15  7:46 AM  Result Value Ref Range Status   Specimen Description SPUTUM  Final   Special Requests NONE  Final   Sputum evaluation   Final    THIS SPECIMEN IS ACCEPTABLE. RESPIRATORY CULTURE REPORT TO FOLLOW.   Report Status 02/22/2015 FINAL  Final  Culture, respiratory (NON-Expectorated)     Status: None   Collection Time: 02/22/15  7:46 AM  Result Value Ref Range Status   Specimen Description SPUTUM  Final   Special Requests NONE  Final   Gram Stain   Final    MODERATE WBC PRESENT,BOTH PMN AND MONONUCLEAR NO SQUAMOUS EPITHELIAL CELLS SEEN ABUNDANT GRAM POSITIVE COCCI IN PAIRS FEW GRAM NEGATIVE RODS RARE GRAM POSITIVE RODS    Culture   Final    ABUNDANT STAPHYLOCOCCUS AUREUS Note: RIFAMPIN AND GENTAMICIN SHOULD NOT BE USED AS SINGLE DRUGS FOR TREATMENT OF STAPH INFECTIONS. Performed at [facility]    Report Status 02/25/2015 FINAL  Final   Organism ID, Bacteria STAPHYLOCOCCUS AUREUS  Final       Susceptibility   Staphylococcus aureus - MIC*    CLINDAMYCIN <=0.25 SENSITIVE Sensitive     ERYTHROMYCIN <=0.25 SENSITIVE Sensitive     GENTAMICIN <=0.5 SENSITIVE Sensitive     LEVOFLOXACIN <=0.12 SENSITIVE Sensitive     OXACILLIN <=0.25 SENSITIVE Sensitive     RIFAMPIN <=0.5 SENSITIVE Sensitive     TRIMETH/SULFA <=10 SENSITIVE Sensitive     VANCOMYCIN 1 SENSITIVE Sensitive     TETRACYCLINE <=1 SENSITIVE Sensitive     MOXIFLOXACIN <=0.25 SENSITIVE Sensitive     * ABUNDANT STAPHYLOCOCCUS AUREUS  MRSA PCR Screening     Status: None   Collection Time: 02/22/15  2:30 PM  Result Value Ref Range Status   MRSA by PCR NEGATIVE NEGATIVE Final    Comment:        The GeneXpert MRSA Assay (FDA approved for NASAL specimens only), is one component of a comprehensive MRSA colonization surveillance program. It is not intended to diagnose MRSA infection nor to guide or monitor treatment for MRSA infections.   AFB culture with smear     Status: None (Preliminary result)   Collection Time: 02/23/15  5:30 AM  Result Value Ref Range Status   Specimen Description SPUTUM  Final   Special Requests NONE  Final   Acid Fast Smear   Final    NO ACID FAST BACILLI SEEN Performed at [facility]    Culture   Final    CULTURE WILL BE EXAMINED FOR 6 WEEKS BEFORE ISSUING A FINAL REPORT Performed at [facility]    Report Status PENDING  Incomplete  AFB culture with smear     Status: None (Preliminary result)   Collection Time: 02/24/15  5:35 AM  Result Value Ref Range Status   Specimen Description SPUTUM  Final   Special Requests NONE  Final   Acid Fast Smear   Final    NO ACID FAST BACILLI SEEN Performed at [facility]    Culture   Final    CULTURE WILL BE EXAMINED FOR 6 WEEKS BEFORE ISSUING A FINAL REPORT Performed at [facility]    Report Status PENDING  Incomplete  AFB culture with smear     Status: None (Preliminary result)   Collection Time: 02/24/15   9:20 AM  Result Value Ref Range Status   Specimen Description BRONCHIAL ALVEOLAR LAVAGE  Final   Special Requests NONE  Final   Acid Fast Smear   Final    NO ACID FAST BACILLI SEEN Performed at [facility]    Culture   Final    CULTURE WILL BE EXAMINED FOR 6 WEEKS BEFORE ISSUING A FINAL REPORT Performed at [facility]    Report Status PENDING  Incomplete  Culture, bal-quantitative     Status: None   Collection Time: 02/24/15  9:20 AM  Result Value Ref Range Status   Specimen Description BRONCHIAL ALVEOLAR LAVAGE  Final   Special Requests NONE  Final   Gram Stain   Final    NO WBC SEEN NO SQUAMOUS EPITHELIAL CELLS SEEN NO ORGANISMS SEEN Performed at [facility]   Final    20,OOO COLONIES/ML Performed at [facility]    Culture   Final    Non-Pathogenic Oropharyngeal-type Flora Isolated. Performed at [facility]    Report Status 02/27/2015 FINAL  Final  Fungus Culture with Smear     Status: None (Preliminary result)   Collection Time: 02/24/15  9:20 AM  Result Value Ref Range Status   Specimen Description BRONCHIAL ALVEOLAR LAVAGE  Final   Special Requests NONE  Final   Fungal Smear   Final    NO YEAST OR FUNGAL ELEMENTS SEEN Performed at [facility]    Culture   Final    CULTURE IN PROGRESS FOR FOUR WEEKS Performed at [facility]    Report Status PENDING  Incomplete     Labs: Basic Metabolic Panel:  Recent Labs Lab 02/24/15 0320 02/25/15 0250 02/26/15 0215 02/27/15 0344 02/28/15 0419  NA 127* 130* 129* 132* 129*  K 3.6 3.6 3.3* 4.1 3.8  CL 95* 97* 95* 97* 93*  CO2 '26 25 26 28 29  '$ GLUCOSE 102* 113* 101* 89 108*  BUN '12 13 14 15 14  '$ CREATININE 0.81 0.77 0.77 0.73 0.79  CALCIUM 8.0* 8.1* 8.3* 8.3* 8.2*  PHOS 2.9 2.9 2.3* 2.4* 3.3   Liver Function Tests:  Recent Labs Lab 02/22/15 0551 02/24/15 0320 02/25/15 0250 02/26/15 0215 02/27/15 0344 02/28/15 0419  AST 57*  --    --   --   --   --   ALT 49  --   --   --   --   --   ALKPHOS 129*  --   --   --   --   --   BILITOT 1.4*  --   --   --   --   --   PROT 6.2*  --   --   --   --   --   ALBUMIN 1.9* 1.7* 1.7* 1.7* 1.6* 1.6*   No results for input(s): LIPASE, AMYLASE in the last 168 hours.  No results for input(s): AMMONIA in the last 168 hours. CBC:  Recent Labs Lab 02/21/15 2302 02/22/15 0551 02/25/15 0250 02/27/15 0344 02/28/15 0419  WBC 8.5 7.8 9.5 10.9* 11.5*  NEUTROABS  --  4.6  --   --   --   HGB 10.5* 10.0* 10.3* 9.9* 10.2*  HCT 31.2* 29.6* 30.8* 29.5* 30.0*  MCV 89.9 88.9 90.6 89.7 90.9  PLT 312 315 311 336 332   Cardiac Enzymes: No results for input(s): CKTOTAL, CKMB, CKMBINDEX, TROPONINI in the last 168 hours. BNP: BNP (last 3 results) No results for input(s): BNP in the last 8760 hours.  ProBNP (last 3 results) No results for input(s): PROBNP in the last 8760 hours.  CBG: No results for input(s): GLUCAP in the last 168 hours.     SignedDomenic Polite  [facility] 02/28/2015, 11:59 AM

## 2015-02-28 NOTE — Telephone Encounter (Signed)
Pt scheduled for rov with BQ already.  Nothing further needed.

## 2015-02-28 NOTE — Progress Notes (Signed)
   Name: GANON DEMASI MRN: [id number] DOB: 1940/09/10    ADMISSION DATE:  02/21/2015 CONSULTATION DATE:  9/21  REFERRING MD :  [facility]  CHIEF COMPLAINT:  SOB, F/C/S  BRIEF PATIENT DESCRIPTION: Long term smoker 74 years of age was admitted on 9/20 emaciated wm with months of purulent sputum and hemoptysis.  Underwent bronchoscopy on 9/23  SIGNIFICANT EVENTS    STUDIES:  9/223 Bronch with BAL and brush > cytology positive for squamous cell carcinoma    SUBJECTIVE:  Learned yesterday he has lung cancer, feels tired but no dyspnea  VITAL SIGNS: Temp:  [97.5 F (36.4 C)-98.7 F (37.1 C)] 97.5 F (36.4 C) (09/27 0852) Pulse Rate:  [72-81] 76 (09/27 0852) Resp:  [17-18] 18 (09/27 0852) BP: (98-127)/(47-79) 113/79 mmHg (09/27 0852) SpO2:  [92 %-96 %] 96 % (09/27 0852) Weight:  [156 lb 1.4 oz (70.8 kg)] 156 lb 1.4 oz (70.8 kg) (09/26 2127)  PHYSICAL EXAMINATION: General: cachectic, lying in bed HENT: several skin lesions neck and scalp PULM: crackles LUL, otherwise clear, normal effort CV: RRR, no mgr GI: BS+, soft, nontender MSK: normal bulk and tone   Recent Labs Lab 02/26/15 0215 02/27/15 0344 02/28/15 0419  NA 129* 132* 129*  K 3.3* 4.1 3.8  CL 95* 97* 93*  CO2 '26 28 29  '$ BUN '14 15 14  '$ CREATININE 0.77 0.73 0.79  GLUCOSE 101* 89 108*    Recent Labs Lab 02/25/15 0250 02/27/15 0344 02/28/15 0419  HGB 10.3* 9.9* 10.2*  HCT 30.8* 29.5* 30.0*  WBC 9.5 10.9* 11.5*  PLT 311 336 332    9/26 CT images personally reviewed showing a large left upper lobe thick walled cavity with surrounding ground glass and consolidation   BAL afb neg, cytology positive for squamous cell carcinoma Sputum MSSA  ASSESSMENT:   Cavitary lesion of lung> due to MSSA pneumonia AND SQUAMOUS CELL CARCINOMA of the lung   Atrial fibrillation with RVR > rate controlled on my exam   Hyponatremia > improving   Anemia without evidence of bleeding   Smoker   Recurrent skin  cancer  Discussion: This is going to be a challenge to treat moving forward as he has both staph pneumonia AND squamous cell carcinoma of the lung.  I would favor treating the staph pneumonia for at least 14 days prior to starting chemotherapy, but if possible would consider delaying treatment for a full month given the severity of the lung involvement on CT and the fact that he grew staph.       PLAN: Treat with ancef for MSSA pna > would treat for 4 weeks oral therapy (keflex) minimum with a probiotic to minimize GI side effects Will need f/u imaging in 6-8 week with a CXR Will have my office arrange a f/u appointment with either me or Dr. Elsworth Soho, whichever is first Needs skin bx with derm consult as outpatient. Has outpatient f/u with Dr. Julien Nordmann He needs a staging Brain MRI with contrast and a PET CT.  Both could be done as an outpatient.    PCCM will sign off  Krosby Ritchie  Roselie Awkward, MD [place] [facility] Pager: [phone] Cell: [phone] After 3pm or if no response, call [phone]

## 2015-02-28 NOTE — Telephone Encounter (Signed)
-----   Message from Juanito Doom, MD sent at 02/27/2015 10:43 AM EDT ----- He's going to be discharged today Please arrange f/u in 6 weeks with me

## 2015-02-28 NOTE — Telephone Encounter (Signed)
S/w Dr. Broadus John and gave np appt for 10/4 @ 11:45 w/Dr. Julien Nordmann.  Referring Dr. Broadus John Dx-squamous cell carcinoma lung

## 2015-02-28 NOTE — Telephone Encounter (Signed)
-----   Message from Juanito Doom, MD sent at 02/28/2015  9:53 AM EDT ----- Hi,  Please make arrangements for him to follow up with me or TP in clinic as a hospital follow up in 4 weeks.  Will need a CXR that day to f/u pneumonia.  Thanks B

## 2015-03-07 ENCOUNTER — Other Ambulatory Visit: Payer: Self-pay | Admitting: Internal Medicine

## 2015-03-07 ENCOUNTER — Encounter: Payer: Self-pay | Admitting: Internal Medicine

## 2015-03-07 ENCOUNTER — Ambulatory Visit (HOSPITAL_BASED_OUTPATIENT_CLINIC_OR_DEPARTMENT_OTHER): Payer: Commercial Managed Care - HMO | Admitting: Internal Medicine

## 2015-03-07 ENCOUNTER — Other Ambulatory Visit (HOSPITAL_BASED_OUTPATIENT_CLINIC_OR_DEPARTMENT_OTHER): Payer: Commercial Managed Care - HMO

## 2015-03-07 ENCOUNTER — Telehealth: Payer: Self-pay | Admitting: Internal Medicine

## 2015-03-07 VITALS — BP 131/64 | HR 86 | Temp 97.8°F | Resp 18 | Ht 72.0 in | Wt 153.0 lb

## 2015-03-07 DIAGNOSIS — C3412 Malignant neoplasm of upper lobe, left bronchus or lung: Secondary | ICD-10-CM

## 2015-03-07 DIAGNOSIS — E440 Moderate protein-calorie malnutrition: Secondary | ICD-10-CM

## 2015-03-07 DIAGNOSIS — G4700 Insomnia, unspecified: Secondary | ICD-10-CM

## 2015-03-07 DIAGNOSIS — Z8051 Family history of malignant neoplasm of kidney: Secondary | ICD-10-CM

## 2015-03-07 DIAGNOSIS — F419 Anxiety disorder, unspecified: Secondary | ICD-10-CM

## 2015-03-07 DIAGNOSIS — C3492 Malignant neoplasm of unspecified part of left bronchus or lung: Secondary | ICD-10-CM

## 2015-03-07 DIAGNOSIS — Z87891 Personal history of nicotine dependence: Secondary | ICD-10-CM

## 2015-03-07 DIAGNOSIS — I4891 Unspecified atrial fibrillation: Secondary | ICD-10-CM | POA: Diagnosis not present

## 2015-03-07 DIAGNOSIS — E46 Unspecified protein-calorie malnutrition: Secondary | ICD-10-CM

## 2015-03-07 LAB — CBC WITH DIFFERENTIAL/PLATELET
BASO%: 1 % (ref 0.0–2.0)
BASOS ABS: 0.1 10*3/uL (ref 0.0–0.1)
EOS ABS: 0.2 10*3/uL (ref 0.0–0.5)
EOS%: 2.3 % (ref 0.0–7.0)
HCT: 31.6 % — ABNORMAL LOW (ref 38.4–49.9)
HEMOGLOBIN: 10.5 g/dL — AB (ref 13.0–17.1)
LYMPH%: 20.2 % (ref 14.0–49.0)
MCH: 30.4 pg (ref 27.2–33.4)
MCHC: 33.2 g/dL (ref 32.0–36.0)
MCV: 91.8 fL (ref 79.3–98.0)
MONO#: 0.7 10*3/uL (ref 0.1–0.9)
MONO%: 9.5 % (ref 0.0–14.0)
NEUT#: 5.2 10*3/uL (ref 1.5–6.5)
NEUT%: 67 % (ref 39.0–75.0)
Platelets: 365 10*3/uL (ref 140–400)
RBC: 3.44 10*6/uL — ABNORMAL LOW (ref 4.20–5.82)
RDW: 14.7 % — AB (ref 11.0–14.6)
WBC: 7.8 10*3/uL (ref 4.0–10.3)
lymph#: 1.6 10*3/uL (ref 0.9–3.3)

## 2015-03-07 LAB — COMPREHENSIVE METABOLIC PANEL (CC13)
ALBUMIN: 2 g/dL — AB (ref 3.5–5.0)
ALK PHOS: 126 U/L (ref 40–150)
ALT: 24 U/L (ref 0–55)
AST: 25 U/L (ref 5–34)
Anion Gap: 6 mEq/L (ref 3–11)
BUN: 12.3 mg/dL (ref 7.0–26.0)
CHLORIDE: 98 meq/L (ref 98–109)
CO2: 29 meq/L (ref 22–29)
Calcium: 8.8 mg/dL (ref 8.4–10.4)
Creatinine: 0.8 mg/dL (ref 0.7–1.3)
EGFR: 89 mL/min/{1.73_m2} — AB (ref >90)
GLUCOSE: 95 mg/dL (ref 70–140)
POTASSIUM: 3.8 meq/L (ref 3.5–5.1)
Sodium: 133 mEq/L — ABNORMAL LOW (ref 136–145)
Total Bilirubin: 0.89 mg/dL (ref 0.20–1.20)
Total Protein: 7 g/dL (ref 6.4–8.3)

## 2015-03-07 MED ORDER — ALPRAZOLAM 0.25 MG PO TABS: 0.2500 mg | ORAL_TABLET | Freq: Every evening | ORAL | Status: AC | PRN

## 2015-03-07 NOTE — Telephone Encounter (Signed)
Pt confirmed labs/ov/nut per 10/04 POF, gave pt AVS and Calendar.... KJ,

## 2015-03-07 NOTE — Telephone Encounter (Signed)
Called patient and conference called [name] office an scheduled np appt for 10/10 @ 1:45 to established care due to insurance. Medical records were faxed over to Attn: [name]. Patient son will accompany father to appt on Monday, Shane Campos is aware of appt.

## 2015-03-07 NOTE — Progress Notes (Signed)
[place] [phone]   [phone]  CONSULT NOTE  REFERRING PHYSICIAN: Dr. Simonne Maffucci  REASON FOR CONSULTATION:  74 years old white male recently diagnosed with lung cancer  HPI Shane Campos is a 74 y.o. male with past medical history significant for atrial fibrillation with rapid ventricular response, anemia of chronic disease, malnutrition, history of pneumonia as well as long history of smoking. The patient has been complaining of increasing shortness of breath, cough productive of dark green blood tinged sputum for few weeks. He presented to the emergency department at [facility] and had chest x-ray performed on 02/21/2015. It showed significant left upper lobe opacity. This was followed by CT angiogram of the chest on 02/22/2015 and it showed a 7.6 x 6.7 cm masslike consolidation with central cavitation in the left infrahilar/ lingula region. The mass extends from the pleural surface into the left hilum. There is encasement and narrowing of the left upper lobe bronchus as well as encasement of the left upper lobe pulmonary artery branch. There is diffuse opacification of the left upper lobe with air bronchograms. There is a small left pleural effusion. On 02/24/2015 the patient underwent bronchoscopy with biopsy of the left infrahilar cavitary lesion by Dr.Yacoub.  The final cytology (Accession: [id number]) of the left upper lobe bronchial brushing showed malignant cells consistent with squamous cell carcinoma. The patient was seen by Dr. Lake Bells on follow-up visit at the pulmonary clinic and he kindly referred him to me today for evaluation and recommendation regarding treatment of his condition. When seen today the patient continues to complain of increasing fatigue and generalized weakness. He also has shortness breath and cough productive of greenish sputum. He denied having any significant chest pain. He lost around 20 pounds over  the last 3 months. He denied having any headache or visual changes. He has swelling of the lower extremities. Family history significant for mother with questionable melanoma father with kidney cancer. The patient is single and has 3 children. He lives with his son Shane Campos in [place]. He is a retired Curator. He has a history of smoking 1 pack per day for around 70 years and he quit 4 weeks ago. He has no history of alcohol or drug abuse.  HPI  Past Medical History  Diagnosis Date  . Cancer ([place])     skin ca    Past Surgical History  Procedure Laterality Date  . Video bronchoscopy Bilateral 02/24/2015    Procedure: VIDEO BRONCHOSCOPY WITHOUT FLUORO;  Surgeon: Rush Farmer, MD;  Location: [place];  Service: Cardiopulmonary;  Laterality: Bilateral;    History reviewed. No pertinent family history.  Social History Social History  Substance Use Topics  . Smoking status: Former Smoker    Quit date: 02/21/2015  . Smokeless tobacco: None  . Alcohol Use: No    No Known Allergies  Current Outpatient Prescriptions  Medication Sig Dispense Refill  . albuterol (PROVENTIL HFA;VENTOLIN HFA) 108 (90 BASE) MCG/ACT inhaler Inhale 2 puffs into the lungs every 6 (six) hours as needed for wheezing or shortness of breath. 1 Inhaler 2  . cephALEXin (KEFLEX) 500 MG capsule Take 1 capsule (500 mg total) by mouth 4 (four) times daily. For 4 weeks 112 capsule 0  . diltiazem (CARDIZEM CD) 180 MG 24 hr capsule Take 1 capsule (180 mg total) by mouth daily. 30 capsule 0  . metoprolol tartrate (LOPRESSOR) 25 MG tablet Take 1 tablet (25 mg total) by mouth 2 (two)  times daily. 60 tablet 0  . pantoprazole (PROTONIX) 40 MG tablet Take 1 tablet (40 mg total) by mouth daily. 30 tablet 0  . polyethylene glycol (MIRALAX / GLYCOLAX) packet Take 17 g by mouth daily as needed. 14 each 0  . saccharomyces boulardii (FLORASTOR) 250 MG capsule Take 1 capsule (250 mg total) by mouth 2 (two) times daily. For 4 weeks  54 capsule 0  . ALPRAZolam (XANAX) 0.25 MG tablet Take 1 tablet (0.25 mg total) by mouth at bedtime as needed for anxiety. 30 tablet 0   No current facility-administered medications for this visit.    Review of Systems  Constitutional: positive for anorexia, fatigue and weight loss Eyes: negative Ears, nose, mouth, throat, and face: negative Respiratory: positive for cough, dyspnea on exertion, hemoptysis and sputum Cardiovascular: negative Gastrointestinal: negative Genitourinary:negative Integument/breast: negative Hematologic/lymphatic: negative Musculoskeletal:negative Neurological: negative Behavioral/Psych: positive for anxiety and sleep disturbance Endocrine: negative Allergic/Immunologic: negative  Physical Exam  LGX:QJJHE, healthy, no distress, well developed, anxious and malnourished SKIN: skin color, texture, turgor are normal, no rashes or significant lesions HEAD: Normocephalic, No masses, lesions, tenderness or abnormalities EYES: normal, PERRLA, Conjunctiva are pink and non-injected EARS: External ears normal, Canals clear OROPHARYNX:no exudate, no erythema and lips, buccal mucosa, and tongue normal  NECK: supple, no adenopathy, no JVD LYMPH:  no palpable lymphadenopathy, no hepatosplenomegaly LUNGS: decreased breath sounds, expiratory wheezes bilaterally HEART: regular rate & rhythm, no murmurs and no gallops ABDOMEN:abdomen soft, non-tender, normal bowel sounds and no masses or organomegaly BACK: Back symmetric, no curvature., No CVA tenderness EXTREMITIES:no joint deformities, effusion, or inflammation, no edema, no skin discoloration  NEURO: alert & oriented x 3 with fluent speech, no focal motor/sensory deficits  PERFORMANCE STATUS: ECOG 1  LABORATORY DATA: Lab Results  Component Value Date   WBC 7.8 03/07/2015   HGB 10.5* 03/07/2015   HCT 31.6* 03/07/2015   MCV 91.8 03/07/2015   PLT 365 03/07/2015      Chemistry      Component Value Date/Time    NA 129* 02/28/2015 0419   K 3.8 02/28/2015 0419   CL 93* 02/28/2015 0419   CO2 29 02/28/2015 0419   BUN 14 02/28/2015 0419   CREATININE 0.79 02/28/2015 0419      Component Value Date/Time   CALCIUM 8.2* 02/28/2015 0419   ALKPHOS 129* 02/22/2015 0551   AST 57* 02/22/2015 0551   ALT 49 02/22/2015 0551   BILITOT 1.4* 02/22/2015 0551       RADIOGRAPHIC STUDIES: Ct Angio Chest Pe W/cm &/or Wo Cm  02/22/2015   CLINICAL DATA:  74 year old male with shortness of breath under chest pain and hemoptysis.  EXAM: CT ANGIOGRAPHY CHEST WITH CONTRAST  TECHNIQUE: Multidetector CT imaging of the chest was performed using the standard protocol during bolus administration of intravenous contrast. Multiplanar CT image reconstructions and MIPs were obtained to evaluate the vascular anatomy.  CONTRAST:  55m OMNIPAQUE IOHEXOL 350 MG/ML SOLN  COMPARISON:  Radiograph dated 09/16  FINDINGS: There is a 7.6 x 6.7 cm masslike consolidation with central cavitation in the left infrahilar/ lingula region. The mass extends from the pleural surface into the left hilum. There is encasement and narrowing of the left upper lobe bronchus as well as encasement of the left upper lobe pulmonary artery branch. There is diffuse opacification of the left upper lobe with air bronchograms. There is a small left pleural effusion.  There is a focal area of nodularity with "Tree in bud appearance" in the right upper  lobe (series 6, image 43 -45) the remainder of the lungs are clear. There is no pneumothorax.  The thoracic aorta appears unremarkable. There is no CT evidence of pulmonary artery embolism. Top-normal subcarinal lymph nodes. There is no cardiomegaly or pericardial effusion. The thyroid gland and esophagus appear grossly unremarkable.  There is no axillary adenopathy. The chest wall soft tissues appear unremarkable. The osseous structures appear intact. There is no evidence of bony erosion or acute fracture. Mild degenerative  changes of the spine.  Review of the MIP images confirms the above findings.  IMPRESSION: No CT evidence of pulmonary embolism.  Left upper lobe/lingula cavitary mass with extension into the left hilum and mediastinum. Differential includes a cavitary neoplasm or an infectious process such as fungal infection, abscess, or TB. Other etiologies are not excluded. Correlation with sputum cultures and pulmonary consult is advised. Bronchoscopy may provide better evaluation if clinically indicated.  Focal right upper lobe tree-in-bud nodular appearance compatible with an infectious process.  Small left pleural effusion.   Electronically Signed   By: Anner Crete M.D.   On: 02/22/2015 00:29   Dg Chest Port 1 View  02/26/2015   CLINICAL DATA:  Dyspnea.  EXAM: PORTABLE CHEST 1 VIEW  COMPARISON:  CT chest 02/21/2015.  Chest radiograph 02/21/2015.  FINDINGS: Left upper lung mass or consolidation with central cavitation. This could be due to neoplasm or necrotizing infection. TB or fungal infection not excluded. There is progression of infiltration now all seen in the left lower lung. Probable atelectasis in the left lung base behind the heart. Small left pleural effusion. Right lung is clear. Normal heart size and pulmonary vascularity.  IMPRESSION: Left upper lung mass or consolidation with central cavitation. Increasing infiltration now present in the left lung base. Small left pleural effusion.   Electronically Signed   By: Lucienne Capers M.D.   On: 02/26/2015 05:47   Dg Chest Port 1 View  02/21/2015   CLINICAL DATA:  Dyspnea and tachycardia tonight  EXAM: PORTABLE CHEST - 1 VIEW  COMPARISON:  02/10/2005  FINDINGS: Lungs are mildly hyperinflated. Heart size is normal. There is dense opacity within the left upper lobe, possibly infectious. Right lung is clear. No pulmonary edema.  IMPRESSION: Significant left upper lobe opacity, possibly an infectious process. Followup PA and lateral chest X-ray is recommended in  3-4 weeks following trial of antibiotic therapy to ensure resolution and exclude underlying malignancy.   Electronically Signed   By: Nolon Nations M.D.   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  PLAN: I had a lengthy discussion with the patient today about his current condition and further investigation to complete the staging workup as well as prognosis and treatment options. I will order a PET scan as well as MRI of the brain to complete the staging workup of his disease. If the patient has no evidence of metastatic disease outside the current location, he would be discussed for either surgical resection or a course of concurrent chemoradiation depending on his pulmonary function and other comorbidities. I will arrange for the patient to come back for follow-up visit to the multidisciplinary thoracic oncology clinic for further evaluation and more detailed discussion of his treatment options. For the malnutrition, I referred the patient to the dietitian  at the [facility] for evaluation of his condition. For anxiety, the patient was given prescription for Xanax 0.25 mg by mouth daily at bedtime as needed for anxiety and insomnia. The patient was advised to call immediately if he has any concerning symptoms in the interval. The patient voices understanding of current disease status and treatment options and is in agreement with the current care plan.  All questions were answered. The patient knows to call the clinic with any problems, questions or concerns. We can certainly see the patient much sooner if necessary.  Thank you so much for allowing me to participate in the care of Shane Campos. I will continue to follow up the  patient with you and assist in his care.  I spent 40 minutes counseling the patient face to face. The total time spent in the appointment was 60 minutes.  Disclaimer: This note was dictated with voice recognition software. Similar sounding words can inadvertently be transcribed and may not be corrected upon review.   Jamill Wetmore K. March 07, 2015, 1:13 PM

## 2015-03-10 ENCOUNTER — Telehealth: Payer: Self-pay | Admitting: *Deleted

## 2015-03-10 ENCOUNTER — Ambulatory Visit (HOSPITAL_COMMUNITY): Admission: RE | Admit: 2015-03-10 | Payer: Commercial Managed Care - HMO | Source: Ambulatory Visit

## 2015-03-10 NOTE — Telephone Encounter (Signed)
Called to set up for thoracic clinic, no answer.  Will call back.

## 2015-03-14 ENCOUNTER — Telehealth: Payer: Self-pay | Admitting: *Deleted

## 2015-03-14 ENCOUNTER — Encounter: Payer: Self-pay | Admitting: Nutrition

## 2015-03-14 NOTE — Telephone Encounter (Signed)
Oncology Nurse Navigator Documentation  Oncology Nurse Navigator Flowsheets 03/14/2015  Navigator Encounter Type Telephone/I called to set up appt for [name], unable to leave a message.    Patient Visit Type Follow-up  Treatment Phase Abnormal Scans  Interventions Coordination of Care  Coordination of Care MD Appointments  Time Spent with Patient 15

## 2015-03-15 ENCOUNTER — Telehealth: Payer: Self-pay | Admitting: *Deleted

## 2015-03-15 ENCOUNTER — Encounter: Payer: Self-pay | Admitting: Nutrition

## 2015-03-15 NOTE — Progress Notes (Signed)
Patient did not show up for scheduled nutrition appointment.

## 2015-03-15 NOTE — Telephone Encounter (Signed)
Oncology Nurse Navigator Documentation  Oncology Nurse Navigator Flowsheets 03/15/2015  Navigator Encounter Type Telephone/Called to set up for thoracic clinic next week.  No answer.   Patient Visit Type -  Treatment Phase Abnormal Scans  Interventions Coordination of Care  Coordination of Care MD Appointments  Time Spent with Patient 15

## 2015-03-16 ENCOUNTER — Other Ambulatory Visit: Payer: Self-pay

## 2015-03-20 ENCOUNTER — Encounter: Payer: Self-pay | Admitting: *Deleted

## 2015-03-20 ENCOUNTER — Telehealth: Payer: Self-pay | Admitting: *Deleted

## 2015-03-20 DIAGNOSIS — C3492 Malignant neoplasm of unspecified part of left bronchus or lung: Secondary | ICD-10-CM

## 2015-03-20 NOTE — Telephone Encounter (Signed)
Oncology Nurse Navigator Documentation  Oncology Nurse Navigator Flowsheets 03/20/2015  Navigator Encounter Type Telephone/I called patient to schedule for [place].  I spoke with him and updated on appt on 03/23/15 arrive at 2:30.  He verbalized understanding of appt time and place.    Patient Visit Type -  Treatment Phase Abnormal Scans  Interventions Coordination of Care  Coordination of Care MD Appointments  Time Spent with Patient 15

## 2015-03-22 ENCOUNTER — Telehealth: Payer: Self-pay | Admitting: *Deleted

## 2015-03-22 ENCOUNTER — Ambulatory Visit (HOSPITAL_COMMUNITY): Payer: Commercial Managed Care - HMO

## 2015-03-22 NOTE — Telephone Encounter (Signed)
Called pt and confirmed 03/23/15 clinic appt w/ him.

## 2015-03-23 ENCOUNTER — Encounter: Payer: Self-pay | Admitting: Internal Medicine

## 2015-03-23 ENCOUNTER — Ambulatory Visit (HOSPITAL_BASED_OUTPATIENT_CLINIC_OR_DEPARTMENT_OTHER): Payer: Commercial Managed Care - HMO | Admitting: Internal Medicine

## 2015-03-23 ENCOUNTER — Ambulatory Visit
Admission: RE | Admit: 2015-03-23 | Discharge: 2015-03-23 | Disposition: A | Discharge status: Home / Self Care | Payer: Commercial Managed Care - HMO | Source: Ambulatory Visit | Attending: Radiation Oncology | Admitting: Radiation Oncology

## 2015-03-23 ENCOUNTER — Encounter: Payer: Self-pay | Admitting: *Deleted

## 2015-03-23 ENCOUNTER — Ambulatory Visit: Payer: Commercial Managed Care - HMO | Attending: Internal Medicine | Admitting: Physical Therapy

## 2015-03-23 ENCOUNTER — Ambulatory Visit: Payer: Commercial Managed Care - HMO | Admitting: Cardiothoracic Surgery

## 2015-03-23 ENCOUNTER — Other Ambulatory Visit (HOSPITAL_BASED_OUTPATIENT_CLINIC_OR_DEPARTMENT_OTHER): Payer: Commercial Managed Care - HMO

## 2015-03-23 VITALS — BP 116/68 | HR 89 | Temp 98.3°F | Resp 18 | Ht 72.0 in | Wt 154.4 lb

## 2015-03-23 DIAGNOSIS — Z9181 History of falling: Secondary | ICD-10-CM

## 2015-03-23 DIAGNOSIS — C3492 Malignant neoplasm of unspecified part of left bronchus or lung: Secondary | ICD-10-CM

## 2015-03-23 DIAGNOSIS — R079 Chest pain, unspecified: Secondary | ICD-10-CM | POA: Diagnosis not present

## 2015-03-23 DIAGNOSIS — R634 Abnormal weight loss: Secondary | ICD-10-CM

## 2015-03-23 DIAGNOSIS — R5381 Other malaise: Secondary | ICD-10-CM | POA: Diagnosis present

## 2015-03-23 DIAGNOSIS — C3412 Malignant neoplasm of upper lobe, left bronchus or lung: Secondary | ICD-10-CM | POA: Diagnosis not present

## 2015-03-23 DIAGNOSIS — R05 Cough: Secondary | ICD-10-CM

## 2015-03-23 DIAGNOSIS — R0609 Other forms of dyspnea: Secondary | ICD-10-CM

## 2015-03-23 DIAGNOSIS — R293 Abnormal posture: Secondary | ICD-10-CM | POA: Diagnosis not present

## 2015-03-23 LAB — CBC WITH DIFFERENTIAL/PLATELET
BASO%: 0.9 % (ref 0.0–2.0)
BASOS ABS: 0.1 10*3/uL (ref 0.0–0.1)
EOS ABS: 0.1 10*3/uL (ref 0.0–0.5)
EOS%: 1.1 % (ref 0.0–7.0)
HEMATOCRIT: 30 % — AB (ref 38.4–49.9)
HEMOGLOBIN: 9.9 g/dL — AB (ref 13.0–17.1)
LYMPH#: 1.9 10*3/uL (ref 0.9–3.3)
LYMPH%: 18.2 % (ref 14.0–49.0)
MCH: 30.3 pg (ref 27.2–33.4)
MCHC: 33.2 g/dL (ref 32.0–36.0)
MCV: 91.3 fL (ref 79.3–98.0)
MONO#: 1.1 10*3/uL — ABNORMAL HIGH (ref 0.1–0.9)
MONO%: 10.6 % (ref 0.0–14.0)
NEUT#: 7.3 10*3/uL — ABNORMAL HIGH (ref 1.5–6.5)
NEUT%: 69.2 % (ref 39.0–75.0)
PLATELETS: 311 10*3/uL (ref 140–400)
RBC: 3.28 10*6/uL — ABNORMAL LOW (ref 4.20–5.82)
RDW: 15.8 % — AB (ref 11.0–14.6)
WBC: 10.5 10*3/uL — ABNORMAL HIGH (ref 4.0–10.3)

## 2015-03-23 LAB — COMPREHENSIVE METABOLIC PANEL (CC13)
ALT: 12 U/L (ref 0–55)
ANION GAP: 5 meq/L (ref 3–11)
AST: 16 U/L (ref 5–34)
Albumin: 1.9 g/dL — ABNORMAL LOW (ref 3.5–5.0)
Alkaline Phosphatase: 113 U/L (ref 40–150)
BUN: 11 mg/dL (ref 7.0–26.0)
CHLORIDE: 96 meq/L — AB (ref 98–109)
CO2: 28 meq/L (ref 22–29)
CREATININE: 0.7 mg/dL (ref 0.7–1.3)
Calcium: 8.9 mg/dL (ref 8.4–10.4)
EGFR: >90 mL/min/{1.73_m2} (ref >90)
GLUCOSE: 102 mg/dL (ref 70–140)
Potassium: 3.9 mEq/L (ref 3.5–5.1)
SODIUM: 130 meq/L — AB (ref 136–145)
TOTAL PROTEIN: 7 g/dL (ref 6.4–8.3)
Total Bilirubin: 0.96 mg/dL (ref 0.20–1.20)

## 2015-03-23 NOTE — Progress Notes (Signed)
Radiation Oncology         [phone] ________________________________  Initial Outpatient Consultation  Name: Shane Campos MRN: [id number]  Date: 03/23/2015  DOB: [birth year]  PO:[id number],IRWERX D, MD  Curt Bears, MD   REFERRING PHYSICIAN: Curt Bears, MD  DIAGNOSIS: Locally Advanced Non-Small Cell Lung Cancer, Stage Pending  HISTORY OF PRESENT ILLNESS::Shane Campos is a 74 y.o. male who presented to the ED on 02/21/15 for ongoing shortness of breath, cough, and hiccups. A chest X-ray was performed and showed dense opacity within the left upper lobe and mildly hyperinflated lungs. Chest CT found a 7.6 x 6.7 cm mass-like consolidation with central cavitation in the left infrahilar/lingula region extending into the left hilum and mediastinum. There was also opacification of the left upper lobe with air bronchogram and a small left pleural effusion. The right upper lobe had a focal area of nodularity that is indicative of an infectious process. On 02/24/15, the patient underwent bronchoscopy with biopsy of the left infrahilar cavitary lesion by Dr. Nelda Marseille. This showed malignant cells consistent with squamous cell carcinoma. Another chest X-ray, performed on 02/26/15, confirmed a left upper lung mass/consolidation with central cavitation and the left pleural effusion. The patient saw Dr. Julien Nordmann on 03/07/15 and he has ordered a PET scan and MRI of the brain. However, the patient decline the PET scan due to not having the funds to cover the co-pay. He The patient presents to lung clinic for my consideration of radiotherapy for the management of his disease.  PREVIOUS RADIATION THERAPY: No  PAST MEDICAL HISTORY:  has a past medical history of Cancer ([place]).    PAST SURGICAL HISTORY: Past Surgical History  Procedure Laterality Date  . Video bronchoscopy Bilateral 02/24/2015    Procedure: VIDEO BRONCHOSCOPY WITHOUT FLUORO;  Surgeon: Rush Farmer, MD;  Location: [place];   Service: Cardiopulmonary;  Laterality: Bilateral;    FAMILY HISTORY: family history is not on file.  SOCIAL HISTORY:  reports that he quit smoking about 4 weeks ago. He does not have any smokeless tobacco history on file. He reports that he does not drink alcohol or use illicit drugs.  ALLERGIES: Review of patient's allergies indicates no known allergies.  MEDICATIONS:  Current Outpatient Prescriptions  Medication Sig Dispense Refill  . albuterol (PROVENTIL HFA;VENTOLIN HFA) 108 (90 BASE) MCG/ACT inhaler Inhale 2 puffs into the lungs every 6 (six) hours as needed for wheezing or shortness of breath. 1 Inhaler 2  . ALPRAZolam (XANAX) 0.25 MG tablet Take 1 tablet (0.25 mg total) by mouth at bedtime as needed for anxiety. 30 tablet 0  . cephALEXin (KEFLEX) 500 MG capsule Take 1 capsule (500 mg total) by mouth 4 (four) times daily. For 4 weeks 112 capsule 0  . diltiazem (CARDIZEM CD) 180 MG 24 hr capsule Take 1 capsule (180 mg total) by mouth daily. 30 capsule 0  . metoprolol tartrate (LOPRESSOR) 25 MG tablet Take 1 tablet (25 mg total) by mouth 2 (two) times daily. 60 tablet 0  . pantoprazole (PROTONIX) 40 MG tablet Take 1 tablet (40 mg total) by mouth daily. 30 tablet 0  . polyethylene glycol (MIRALAX / GLYCOLAX) packet Take 17 g by mouth daily as needed. 14 each 0  . saccharomyces boulardii (FLORASTOR) 250 MG capsule Take 1 capsule (250 mg total) by mouth 2 (two) times daily. For 4 weeks 54 capsule 0   No current facility-administered medications for this encounter.    REVIEW OF SYSTEMS:  A 15 point review of  systems is documented in the electronic medical record. This was obtained by the nursing staff. However, I reviewed this with the patient to discuss relevant findings and make appropriate changes.  The patient reports shortness and breath, a productive cough, hemoptysis, hiccups, and fatigue. He denies chest pain, visual changes, or headaches. However, he does report some back pain. He  also reports some weight loss.   PHYSICAL EXAM:  Vitals - 1 value per visit 44/81/8563  SYSTOLIC 149  DIASTOLIC 68  Pulse 89  Temperature 98.3  Respirations 18  Weight (lb) 154.4  Height '6\' 0"'$   BMI 20.94  VISIT REPORT    General: Alert and oriented, in no acute distress HEENT: Head is normocephalic. Extraocular movements are intact. Oropharynx is clear. The patient is edentulous with upper dentures. Neck: Neck is supple, no palpable cervical or supraclavicular lymphadenopathy. Heart: Regular in rate and rhythm with no murmurs, rubs, or gallops. Chest: Clear to auscultation bilaterally, with no rhonchi, wheezes, or rales, mildly decreased breath sounds in the right lung field Abdomen: Soft, nontender, nondistended, with no rigidity or guarding. Extremities: Patient has pitting edema of the bilateral feet and ankles. The patient reports this has been present for a month. Lymphatics: see Neck Exam Musculoskeletal: symmetric strength and muscle tone throughout. Neurologic: Cranial nerves II through XII are grossly intact. No obvious focalities. Speech is fluent. Coordination is intact. Psychiatric: Judgment and insight are intact. Affect is appropriate. The patient wears Band-Aids over what he describes his skin cancers  ECOG = 1  LABORATORY DATA:  Lab Results  Component Value Date   WBC 10.5* 03/23/2015   HGB 9.9* 03/23/2015   HCT 30.0* 03/23/2015   MCV 91.3 03/23/2015   PLT 311 03/23/2015   NEUTROABS 7.3* 03/23/2015   Lab Results  Component Value Date   NA 130* 03/23/2015   K 3.9 03/23/2015   CL 93* 02/28/2015   CO2 28 03/23/2015   GLUCOSE 102 03/23/2015   CREATININE 0.7 03/23/2015   CALCIUM 8.9 03/23/2015    RADIOGRAPHY: Ct Angio Chest Pe W/cm &/or Wo Cm  02/22/2015  CLINICAL DATA:  74 year old male with shortness of breath under chest pain and hemoptysis. EXAM: CT ANGIOGRAPHY CHEST WITH CONTRAST TECHNIQUE: Multidetector CT imaging of the chest was performed using  the standard protocol during bolus administration of intravenous contrast. Multiplanar CT image reconstructions and MIPs were obtained to evaluate the vascular anatomy. CONTRAST:  53m OMNIPAQUE IOHEXOL 350 MG/ML SOLN COMPARISON:  Radiograph dated 09/16 FINDINGS: There is a 7.6 x 6.7 cm masslike consolidation with central cavitation in the left infrahilar/ lingula region. The mass extends from the pleural surface into the left hilum. There is encasement and narrowing of the left upper lobe bronchus as well as encasement of the left upper lobe pulmonary artery branch. There is diffuse opacification of the left upper lobe with air bronchograms. There is a small left pleural effusion. There is a focal area of nodularity with "Tree in bud appearance" in the right upper lobe (series 6, image 43 -45) the remainder of the lungs are clear. There is no pneumothorax. The thoracic aorta appears unremarkable. There is no CT evidence of pulmonary artery embolism. Top-normal subcarinal lymph nodes. There is no cardiomegaly or pericardial effusion. The thyroid gland and esophagus appear grossly unremarkable. There is no axillary adenopathy. The chest wall soft tissues appear unremarkable. The osseous structures appear intact. There is no evidence of bony erosion or acute fracture. Mild degenerative changes of the spine. Review of the MIP  images confirms the above findings. IMPRESSION: No CT evidence of pulmonary embolism. Left upper lobe/lingula cavitary mass with extension into the left hilum and mediastinum. Differential includes a cavitary neoplasm or an infectious process such as fungal infection, abscess, or TB. Other etiologies are not excluded. Correlation with sputum cultures and pulmonary consult is advised. Bronchoscopy may provide better evaluation if clinically indicated. Focal right upper lobe tree-in-bud nodular appearance compatible with an infectious process. Small left pleural effusion. Electronically Signed   By:  Anner Crete M.D.   On: 02/22/2015 00:29   Dg Chest Port 1 View  02/26/2015  CLINICAL DATA:  Dyspnea. EXAM: PORTABLE CHEST 1 VIEW COMPARISON:  CT chest 02/21/2015.  Chest radiograph 02/21/2015. FINDINGS: Left upper lung mass or consolidation with central cavitation. This could be due to neoplasm or necrotizing infection. TB or fungal infection not excluded. There is progression of infiltration now all seen in the left lower lung. Probable atelectasis in the left lung base behind the heart. Small left pleural effusion. Right lung is clear. Normal heart size and pulmonary vascularity. IMPRESSION: Left upper lung mass or consolidation with central cavitation. Increasing infiltration now present in the left lung base. Small left pleural effusion. Electronically Signed   By: Lucienne Capers M.D.   On: 02/26/2015 05:47   Dg Chest Port 1 View  02/21/2015  CLINICAL DATA:  Dyspnea and tachycardia tonight EXAM: PORTABLE CHEST - 1 VIEW COMPARISON:  02/10/2005 FINDINGS: Lungs are mildly hyperinflated. Heart size is normal. There is dense opacity within the left upper lobe, possibly infectious. Right lung is clear. No pulmonary edema. IMPRESSION: Significant left upper lobe opacity, possibly an infectious process. Followup PA and lateral chest X-ray is recommended in 3-4 weeks following trial of antibiotic therapy to ensure resolution and exclude underlying malignancy. Electronically Signed   By: Nolon Nations M.D.   On: 02/21/2015 23:14      IMPRESSION: Locally Advanced Non-Small Cell Lung Cancer, Stage Pending  PLAN: We discussed his diagnosis and stage. We discussed the process of simulation and the placement of tattoos. We discussed dysphagia, skin irritation, and fatigue as the acute side effects of radiation. The patient will proceed with PET scan in the near future and will schedule for CT simulation next week. Final recommendations concerning radiation therapy will depend on results of the patient's  PET scan and brain MRI    This document serves as a record of services personally performed by Gery Pray, MD. It was created on his behalf by Darcus Austin, a trained medical scribe. The creation of this record is based on the scribe's personal observations and the provider's statements to them. This document has been checked and approved by the attending provider.  ------------------------------------------------  Blair Promise, PhD, MD

## 2015-03-23 NOTE — Therapy (Signed)
[place], [place], [address] Phone: [phone]   Fax:  [phone]  Physical Therapy Evaluation  Patient Details  Name: Shane Campos MRN: [id number] Date of Birth: Apr 09, 1941 Referring Provider: Dr. Curt Bears  Encounter Date: 03/23/2015      PT End of Session - 03/23/15 1553    Visit Number 1   Number of Visits 1   PT Start Time 1884   PT Stop Time 1513   PT Time Calculation (min) 20 min   Activity Tolerance Patient tolerated treatment well   Behavior During Therapy [facility] for tasks assessed/performed      Past Medical History  Diagnosis Date  . Cancer ([place])     skin ca    Past Surgical History  Procedure Laterality Date  . Video bronchoscopy Bilateral 02/24/2015    Procedure: VIDEO BRONCHOSCOPY WITHOUT FLUORO;  Surgeon: Rush Farmer, MD;  Location: [facility];  Service: Cardiopulmonary;  Laterality: Bilateral;    There were no vitals filed for this visit.  Visit Diagnosis:  Acquired postural deformity - Plan: PT plan of care cert/re-cert  Physical deconditioning - Plan: PT plan of care cert/re-cert  Personal history of fall - Plan: PT plan of care cert/re-cert      Subjective Assessment - 03/23/15 1539    Subjective reports some shortness of breath with activity   Pertinent History Patient presented with cough and SOB to emergency department on 02/21/15; workup showed left upper lobe cavitary mass.  Also has pleural effusion.  Bronchoscopy done; bronchial brushing showed presence of malignant cells consistent with squamous cell carcinoma; also had positive node on scan. May be stage III but further diiagnostic workup needed.  Expected to have chemoradiation.  Ex-smoker who quit 02/21/15 (70 pack-years); h/o skin cancer with recurrence.    Patient Stated Goals get info from all clinic providers   Currently in Pain? No/denies            [facility] PT Assessment - 03/23/15  0001    Assessment   Medical Diagnosis left upper lobe cavitary mass; bronchial brushing had malignant cells present consistent with squamous cell CA   Referring Provider Dr. Curt Bears   Onset Date/Surgical Date 02/21/15   Precautions   Precautions Other (comment)   Precaution Comments cancer precautions   Restrictions   Weight Bearing Restrictions No   Balance Screen   Has the patient fallen in the past 6 months Yes   How many times? 1  foot slipped on acorns as he stepped off stairs   Has the patient had a decrease in activity level because of a fear of falling?  No   Is the patient reluctant to leave their home because of a fear of falling?  No   Home Environment   Living Environment Private residence   Living Arrangements Children  son   Type of [place] to enter   Entrance Stairs-Number of Steps [place] One level   Prior Function   Level of [place] Retired   Leisure no regular exercise   Cognition   Overall Cognitive Status Within Functional Limits for tasks assessed   Observation/Other Assessments   Observations older man who looks pale and gaunt   Functional Tests   Functional tests Sit to Stand   Sit to Stand   Comments 6 times in 30 seconds, below average for age  is SOB and reports  tired legs afterwards   Posture/Postural Control   Posture/Postural Control Postural limitations   Postural Limitations Forward head  significant   ROM / Strength   AROM / PROM / Strength AROM   AROM   Overall AROM  Deficits   Overall AROM Comments In standing:  active trunk flexion--reaches fingertips 8 inches to floor; extension 25% loss, right sidebend WFL, left sidebend 20% loss; right rotation 10% loss, left 25% loss   Ambulation/Gait   Ambulation/Gait Yes   Ambulation/Gait Assistance 6: Modified independent (Device/Increase time)  reports shortness of breath with some activity   Balance   Balance Assessed Yes    Dynamic Standing Balance   Dynamic Standing - Comments reaches 12 inches forward in standing   = just under average for his age                           PT Education - Mar 30, 2015 1552    Education provided Yes   Education Details energy conservation, walking, Cure article on staying active through treatment, posture, breathing, PT info   Person(s) Educated Patient   Methods Explanation;Handout   Comprehension Verbalized understanding               Lung Clinic Goals - March 30, 2015 1558    Patient will be able to verbalize understanding of the benefit of exercise to decrease fatigue.   Status Achieved   Patient will be able to verbalize the importance of posture.   Status Achieved   Patient will be able to demonstrate diaphragmatic breathing for improved lung function.   Status Achieved   Patient will be able to verbalize understanding of the role of physical therapy to prevent functional decline and who to contact if physical therapy is needed.   Status Achieved             Plan - Mar 30, 2015 1553    Clinical Impression Statement Older gentleman who looks pale and gaunt and reports shortness of breath with some activities, newly diagnosed with lung cancer, possibly stage III.  h/o falling when he slipped on acorns.   Pt will benefit from skilled therapeutic intervention in order to improve on the following deficits Cardiopulmonary status limiting activity;Postural dysfunction   Rehab Potential Good   PT Frequency One time visit   PT Treatment/Interventions Patient/family education   PT Next Visit Plan None at this time.   PT Home Exercise Plan see education section   Consulted and Agree with Plan of Care Patient          G-Codes - 03/30/15 1558    Functional Assessment Tool Used clinical judgement   Functional Limitation Mobility: Walking and moving around   Mobility: Walking and Moving Around Current Status [phone]) At least 20 percent but less  than 40 percent impaired, limited or restricted   Mobility: Walking and Moving Around Goal Status [phone]) At least 20 percent but less than 40 percent impaired, limited or restricted   Mobility: Walking and Moving Around Discharge Status [phone]) At least 20 percent but less than 40 percent impaired, limited or restricted       Problem List Patient Active Problem List   Diagnosis Date Noted  . 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 Cavitary lesion of lung   . Acute respiratory failure with hypoxia ([place]) 02/24/2015  . Anemia of chronic disease 02/24/2015  . Right upper lobe pneumonia 02/24/2015  . 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 Moderate malnutrition ([place]) 02/24/2015  . Hiccup 02/23/2015  . Atrial fibrillation with RVR ([place]) 02/22/2015  . Hyponatremia 02/22/2015  . Smoker 02/22/2015  . Recurrent skin cancer 02/22/2015    Kla Bily 03/23/2015, 4:00 PM  [place], [place], [address] Phone: [phone]   Fax:  [phone]  Name: DECODA VAN MRN: [id number] Date of Birth: 07/12/1940   Serafina Royals, PT 03/23/2015 4:00 PM

## 2015-03-23 NOTE — Progress Notes (Signed)
[facility] Clinical Social Work  Clinical Social Work met with patient/family and Futures trader at [facility] appointment to offer support and assess for psychosocial needs.  Medical oncologist explained to patient that he needs PET scan and brain MRI before they can proceed with diagnosis and treatment options.  The patient shared he did not receive tests because he cannot afford the co-pay.  CSW and patient discussed necessity of scans.  Patient agreed to meet with financial advocate to discuss applying for Medicaid.  Patient hopeful that he may qualify for Medicaid as secondary insurance to cover cost of co-pay.  Clinical Social Work briefly discussed Clinical Social Work role and [employer] support programs/services.  Clinical Social Work encouraged patient to call with any additional questions or concerns.   Polo Riley, MSW, LCSW, OSW-C Clinical Social Worker [facility] [phone]

## 2015-03-23 NOTE — Progress Notes (Signed)
Pt inquired about applying for Medicaid so I gave him a Medicaid application and highlighted the area of the documentation that's needed for processing.  He understands once completed he has to mail or drop it off at [facility] on [facility].

## 2015-03-23 NOTE — Progress Notes (Signed)
[place] Telephone:([phone]) [phone]   Fax:([phone]  OFFICE PROGRESS NOTE  Kandice Hams, MD 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  PRIOR THERAPY: None  CURRENT THERAPY: None  INTERVAL HISTORY: Shane Campos 74 y.o. male returns to the clinic today for follow-up visit. The patient was last seen 2 weeks ago. At that time I ordered a PET scan in addition to MRI of the brain. The patient did not show up for his appointment because he was worried about the copayment for these procedures. He came today for reevaluation and discussion of his treatment options. He is feeling fine except for the fatigue as well as the chest pain and cough. He also has shortness breath with exertion. The patient continues to lose weight. He denied having any nausea or vomiting. He denied having any fever or chills. No significant headache or visual changes.  MEDICAL HISTORY: Past Medical History  Diagnosis Date  . Cancer ([place])     skin ca    ALLERGIES:  has No Known Allergies.  MEDICATIONS:  Current Outpatient Prescriptions  Medication Sig Dispense Refill  . albuterol (PROVENTIL HFA;VENTOLIN HFA) 108 (90 BASE) MCG/ACT inhaler Inhale 2 puffs into the lungs every 6 (six) hours as needed for wheezing or shortness of breath. 1 Inhaler 2  . ALPRAZolam (XANAX) 0.25 MG tablet Take 1 tablet (0.25 mg total) by mouth at bedtime as needed for anxiety. 30 tablet 0  . cephALEXin (KEFLEX) 500 MG capsule Take 1 capsule (500 mg total) by mouth 4 (four) times daily. For 4 weeks 112 capsule 0  . diltiazem (CARDIZEM CD) 180 MG 24 hr capsule Take 1 capsule (180 mg total) by mouth daily. 30 capsule 0  . metoprolol tartrate (LOPRESSOR) 25 MG tablet Take 1 tablet (25 mg total) by  mouth 2 (two) times daily. 60 tablet 0  . pantoprazole (PROTONIX) 40 MG tablet Take 1 tablet (40 mg total) by mouth daily. 30 tablet 0  . polyethylene glycol (MIRALAX / GLYCOLAX) packet Take 17 g by mouth daily as needed. 14 each 0  . saccharomyces boulardii (FLORASTOR) 250 MG capsule Take 1 capsule (250 mg total) by mouth 2 (two) times daily. For 4 weeks 54 capsule 0   No current facility-administered medications for this visit.    SURGICAL HISTORY:  Past Surgical History  Procedure Laterality Date  . Video bronchoscopy Bilateral 02/24/2015    Procedure: VIDEO BRONCHOSCOPY WITHOUT FLUORO;  Surgeon: Rush Farmer, MD;  Location: [facility];  Service: Cardiopulmonary;  Laterality: Bilateral;    REVIEW OF SYSTEMS:  Constitutional: positive for anorexia, fatigue and weight loss Eyes: negative Ears, nose, mouth, throat, and face: negative Respiratory: positive for cough, dyspnea on exertion and wheezing Cardiovascular: negative Gastrointestinal: negative Genitourinary:negative Integument/breast: negative Hematologic/lymphatic: negative Musculoskeletal:negative Neurological: negative Behavioral/Psych: negative Endocrine: negative Allergic/Immunologic: negative   PHYSICAL EXAMINATION: General appearance: alert, cooperative, fatigued and no distress Head: Normocephalic, without obvious abnormality, atraumatic Neck: no adenopathy, no JVD, supple, symmetrical, trachea midline and thyroid not enlarged, symmetric, no tenderness/mass/nodules Lymph nodes: Cervical, supraclavicular, and axillary nodes normal. Resp: rales bilaterally and wheezes bilaterally Back: symmetric, no curvature. ROM normal. No CVA tenderness. Cardio: regular rate and rhythm, S1, S2 normal, no murmur, click, rub or gallop GI: soft, non-tender; bowel sounds normal; no masses,  no  organomegaly Extremities: extremities normal, atraumatic, no cyanosis or edema Neurologic: Alert and oriented X 3, normal strength and  tone. Normal symmetric reflexes. Normal coordination and gait  ECOG PERFORMANCE STATUS: 1 - Symptomatic but completely ambulatory  Blood pressure 116/68, pulse 89, temperature 98.3 F (36.8 C), temperature source Oral, resp. rate 18, height 6' (1.829 m), weight 154 lb 6.4 oz (70.035 kg), SpO2 99 %.  LABORATORY DATA: Lab Results  Component Value Date   WBC 10.5* 03/23/2015   HGB 9.9* 03/23/2015   HCT 30.0* 03/23/2015   MCV 91.3 03/23/2015   PLT 311 03/23/2015      Chemistry      Component Value Date/Time   NA 130* 03/23/2015 1418   NA 129* 02/28/2015 0419   K 3.9 03/23/2015 1418   K 3.8 02/28/2015 0419   CL 93* 02/28/2015 0419   CO2 28 03/23/2015 1418   CO2 29 02/28/2015 0419   BUN 11.0 03/23/2015 1418   BUN 14 02/28/2015 0419   CREATININE 0.7 03/23/2015 1418   CREATININE 0.79 02/28/2015 0419      Component Value Date/Time   CALCIUM 8.9 03/23/2015 1418   CALCIUM 8.2* 02/28/2015 0419   ALKPHOS 113 03/23/2015 1418   ALKPHOS 129* 02/22/2015 0551   AST 16 03/23/2015 1418   AST 57* 02/22/2015 0551   ALT 12 03/23/2015 1418   ALT 49 02/22/2015 0551   BILITOT 0.96 03/23/2015 1418   BILITOT 1.4* 02/22/2015 0551       RADIOGRAPHIC STUDIES: Ct Angio Chest Pe W/cm &/or Wo Cm  02/22/2015  CLINICAL DATA:  74 year old male with shortness of breath under chest pain and hemoptysis. EXAM: CT ANGIOGRAPHY CHEST WITH CONTRAST TECHNIQUE: Multidetector CT imaging of the chest was performed using the standard protocol during bolus administration of intravenous contrast. Multiplanar CT image reconstructions and MIPs were obtained to evaluate the vascular anatomy. CONTRAST:  34m OMNIPAQUE IOHEXOL 350 MG/ML SOLN COMPARISON:  Radiograph dated 09/16 FINDINGS: There is a 7.6 x 6.7 cm masslike consolidation with central cavitation in the left infrahilar/ lingula region. The mass extends from the pleural surface into the left hilum. There is encasement and narrowing of the left upper lobe bronchus  as well as encasement of the left upper lobe pulmonary artery branch. There is diffuse opacification of the left upper lobe with air bronchograms. There is a small left pleural effusion. There is a focal area of nodularity with "Tree in bud appearance" in the right upper lobe (series 6, image 43 -45) the remainder of the lungs are clear. There is no pneumothorax. The thoracic aorta appears unremarkable. There is no CT evidence of pulmonary artery embolism. Top-normal subcarinal lymph nodes. There is no cardiomegaly or pericardial effusion. The thyroid gland and esophagus appear grossly unremarkable. There is no axillary adenopathy. The chest wall soft tissues appear unremarkable. The osseous structures appear intact. There is no evidence of bony erosion or acute fracture. Mild degenerative changes of the spine. Review of the MIP images confirms the above findings. IMPRESSION: No CT evidence of pulmonary embolism. Left upper lobe/lingula cavitary mass with extension into the left hilum and mediastinum. Differential includes a cavitary neoplasm or an infectious process such as fungal infection, abscess, or TB. Other etiologies are not excluded. Correlation with sputum cultures and pulmonary consult is advised. Bronchoscopy may provide better evaluation if clinically indicated. Focal right upper lobe tree-in-bud nodular appearance compatible with an infectious process. Small left pleural effusion. Electronically Signed   By: ALaren EvertsD.  On: 02/22/2015 00:29   Dg Chest Port 1 View  02/26/2015  CLINICAL DATA:  Dyspnea. EXAM: PORTABLE CHEST 1 VIEW COMPARISON:  CT chest 02/21/2015.  Chest radiograph 02/21/2015. FINDINGS: Left upper lung mass or consolidation with central cavitation. This could be due to neoplasm or necrotizing infection. TB or fungal infection not excluded. There is progression of infiltration now all seen in the left lower lung. Probable atelectasis in the left lung base behind the heart.  Small left pleural effusion. Right lung is clear. Normal heart size and pulmonary vascularity. IMPRESSION: Left upper lung mass or consolidation with central cavitation. Increasing infiltration now present in the left lung base. Small left pleural effusion. Electronically Signed   By: Lucienne Capers M.D.   On: 02/26/2015 05:47   Dg Chest Port 1 View  02/21/2015  CLINICAL DATA:  Dyspnea and tachycardia tonight EXAM: PORTABLE CHEST - 1 VIEW COMPARISON:  02/10/2005 FINDINGS: Lungs are mildly hyperinflated. Heart size is normal. There is dense opacity within the left upper lobe, possibly infectious. Right lung is clear. No pulmonary edema. IMPRESSION: Significant left upper lobe opacity, possibly an infectious process. Followup PA and lateral chest X-ray is recommended in 3-4 weeks following trial of antibiotic therapy to ensure resolution and exclude underlying malignancy. Electronically Signed   By: Nolon Nations M.D.   On: 02/21/2015 23:14    ASSESSMENT AND PLAN: This is a very pleasant 74 years old white male with questionably stage IIb/IIIa non-small cell lung cancer, squamous cell carcinoma diagnosed in September 2016. The patient also has suspicious left pleural effusion. He was scheduled for a PET scan as well as MRI of the brain but unfortunately he did not show up for his appointment because of concern about the copayment for this procedure. I arranged for the patient to see the social worker at the [facility] today and she will try to help the patient was application for Medicaid to cover his copayment for this procedure. If no evidence for metastatic disease, we'll consider the patient for a course of concurrent chemoradiation with weekly carboplatin and paclitaxel. The patient was seen later today by Dr. Sondra Come for discussion of the radiotherapy option. I will arrange for him to come back for follow-up visit in less than 2 weeks for more detailed discussion of his treatment based on the  final staging workup. The patient was advised to call immediately if he has any concerning symptoms in the interval. He was seen during the multidisciplinary thoracic oncology clinic today by medical oncology, radiation oncology, thoracic navigator, social worker and physical therapist. The patient was advised to call immediately if he has any concerning symptoms in the interval. The patient voices understanding of current disease status and treatment options and is in agreement with the current care plan.  All questions were answered. The patient knows to call the clinic with any problems, questions or concerns. We can certainly see the patient much sooner if necessary.  I spent 20 minutes counseling the patient face to face. The total time spent in the appointment was 30 minutes.  Disclaimer: This note was dictated with voice recognition software. Similar sounding words can inadvertently be transcribed and may not be corrected upon review.

## 2015-03-24 ENCOUNTER — Telehealth: Payer: Self-pay | Admitting: *Deleted

## 2015-03-24 LAB — FUNGUS CULTURE W SMEAR: Fungal Smear: NONE SEEN

## 2015-03-24 NOTE — Telephone Encounter (Signed)
Oncology Nurse Navigator Documentation  Oncology Nurse Navigator Flowsheets 03/24/2015  Navigator Encounter Type Telephone/I followed up to see if patient's PET and MRI have been scheduled.  I noticed they have not been.  I spoke this Vaughan Basta, to see if scans are precerted, they are.  I called central scheduling to schedule.  I then called patient and gave him appt for PET and MRI Brain on 04/03/15 arrive at 6:30 am.  Patient verbalized understanding of appt time and place.   Patient Visit Type -  Treatment Phase Abnormal Scans  Interventions Coordination of Care  Coordination of Care Radiology  Time Spent with Patient 45

## 2015-03-30 ENCOUNTER — Ambulatory Visit: Payer: Commercial Managed Care - HMO | Admitting: Physical Therapy

## 2015-03-30 ENCOUNTER — Ambulatory Visit
Admission: RE | Admit: 2015-03-30 | Discharge: 2015-03-30 | Disposition: A | Discharge status: Home / Self Care | Payer: Commercial Managed Care - HMO | Source: Ambulatory Visit | Attending: Radiation Oncology | Admitting: Radiation Oncology

## 2015-03-30 ENCOUNTER — Inpatient Hospital Stay: Payer: Self-pay | Admitting: Pulmonary Disease

## 2015-04-03 ENCOUNTER — Other Ambulatory Visit (HOSPITAL_COMMUNITY): Payer: Self-pay

## 2015-04-03 ENCOUNTER — Ambulatory Visit (HOSPITAL_COMMUNITY): Admission: RE | Admit: 2015-04-03 | Payer: Commercial Managed Care - HMO | Source: Ambulatory Visit

## 2015-04-03 ENCOUNTER — Encounter (HOSPITAL_COMMUNITY): Payer: Commercial Managed Care - HMO

## 2015-04-05 ENCOUNTER — Telehealth: Payer: Self-pay | Admitting: *Deleted

## 2015-04-05 NOTE — Telephone Encounter (Signed)
Oncology Nurse Navigator Documentation  Oncology Nurse Navigator Flowsheets 04/05/2015  Navigator Encounter Type Telephone/Called to follow up with patient. I was unable to reach or leave a vm message for patient.  I called contact person and left vm message to call with my name and phone number.    Patient Visit Type -  Treatment Phase Abnormal Scans  Interventions Coordination of Care  Coordination of Care -  Time Spent with Patient 15

## 2015-04-06 ENCOUNTER — Telehealth: Payer: Self-pay | Admitting: *Deleted

## 2015-04-06 NOTE — Telephone Encounter (Signed)
Oncology Nurse Navigator Documentation  Oncology Nurse Navigator Flowsheets 04/06/2015  Navigator Encounter Type Telephone/I called patient's home and was unable to reach or leave a vm message.  I call contact person, Tim and left him a vm message to call with my name and phone number.   Patient Visit Type -  Treatment Phase Abnormal Scans  Interventions Coordination of Care  Coordination of Care -  Time Spent with Patient 15

## 2015-04-08 LAB — AFB CULTURE WITH SMEAR (NOT AT ARMC)
Acid Fast Smear: NONE SEEN
Acid Fast Smear: NONE SEEN

## 2015-04-10 LAB — AFB CULTURE WITH SMEAR (NOT AT ARMC)
ACID FAST SMEAR: NONE SEEN
ACID FAST SMEAR: NONE SEEN

## 2015-04-11 ENCOUNTER — Telehealth: Payer: Self-pay | Admitting: *Deleted

## 2015-04-11 NOTE — Telephone Encounter (Signed)
Oncology Nurse Navigator Documentation  Oncology Nurse Navigator Flowsheets 04/11/2015  Navigator Encounter Type Telephone/I received a call from police officer.  He stated he was able to speak with patient. Apparently, patient stated he was sleeping on good ear and did not hear phone.  I asked the officer to let him know I was going to call him to set up for PET and MRI Brain.  I called home phone, work phone and was again unable to reach.  I called and left vm message with Shane Campos and with the [phone] number.  I gave pre-scan instructions.  I also asked for someone to call me.    Treatment Phase Treatment  Interventions Coordination of Care  Time Spent with Patient 45

## 2015-04-11 NOTE — Telephone Encounter (Signed)
Oncology Nurse Navigator Documentation  Oncology Nurse Navigator Flowsheets 04/11/2015  Navigator Encounter Type Telephone/I called patient to check on him.  I called his home, and his work.  I was unable to leave a vm message.  I called family, Treyton Slimp and left him a vm message to call with my name and phone number.  I also call his pharmacy to make sure I had the right phone number.  They gave me another number [phone].  I called and left vm message to call with my name and phone number.  I also called [facility] non-emergency number.  They will be going to check on patient.    Treatment Phase Abnormal Scans  Interventions Coordination of Care  Time Spent with Patient 45

## 2015-04-17 ENCOUNTER — Telehealth: Payer: Self-pay | Admitting: *Deleted

## 2015-04-17 NOTE — Telephone Encounter (Signed)
Oncology Nurse Navigator Documentation  Oncology Nurse Navigator Flowsheets 04/17/2015  Navigator Encounter Type Telephone/I called to follow up with patient regarding appt on 04/25/15.  I was unable to reach him at home or work.  I left vm message with his son Octavia Bruckner and I also left vm message on [phone].  I left my name and phone number to call  Treatment Phase Abnormal Scans  Interventions Coordination of Care  Time Spent with Patient 15

## 2015-04-25 ENCOUNTER — Encounter (HOSPITAL_COMMUNITY): Payer: Commercial Managed Care - HMO

## 2015-04-25 ENCOUNTER — Ambulatory Visit (HOSPITAL_COMMUNITY): Admission: RE | Admit: 2015-04-25 | Payer: Commercial Managed Care - HMO | Source: Ambulatory Visit

## 2015-05-04 ENCOUNTER — Telehealth: Payer: Self-pay | Admitting: *Deleted

## 2015-05-04 NOTE — Telephone Encounter (Signed)
Oncology Nurse Navigator Documentation  Oncology Nurse Navigator Flowsheets 05/04/2015  Navigator Encounter Type Telephone/Called patient's home and cell phone I was unable to reach or leave a vm message.  I called son's phone and left a message to call with my name and phone number.    Patient Visit Type -  Treatment Phase Abnormal Scans  Interventions Coordination of Care  Coordination of Care -  Time Spent with Patient 15

## 2015-05-10 ENCOUNTER — Telehealth: Payer: Self-pay | Admitting: *Deleted

## 2015-05-10 NOTE — Telephone Encounter (Signed)
Oncology Nurse Navigator Documentation  Oncology Nurse Navigator Flowsheets 05/10/2015  Navigator Encounter Type Telephone/I was able to reach Shane Campos today with the help of Tiffany in HIM.  I asked him if he would like to receive tx.  He stated he was not sure and needs sometime to think about it.  I listened as he explained.  I stated I would call back for an update on his decision.   Patient Visit Type -  Treatment Phase Abnormal Scans  Interventions Coordination of Care  Coordination of Care MD Appointments  Time Spent with Patient 15

## 2015-05-15 ENCOUNTER — Telehealth: Payer: Self-pay | Admitting: *Deleted

## 2015-05-15 NOTE — Telephone Encounter (Signed)
Oncology Nurse Navigator Documentation  Oncology Nurse Navigator Flowsheets 05/15/2015  Navigator Encounter Type Telephone/called to follow up with Mr. Schiller to see about his tx plan.  I left vm message with his son with my name and phone number  Patient Visit Type -  Treatment Phase Abnormal Scans  Interventions Coordination of Care  Coordination of Care MD Appointments  Time Spent with Patient 15

## 2015-06-30 ENCOUNTER — Encounter: Payer: Self-pay | Admitting: *Deleted

## 2015-06-30 NOTE — Progress Notes (Signed)
Oncology Nurse Navigator Documentation  Oncology Nurse Navigator Flowsheets 06/30/2015  Navigator Encounter Type Telephone/I called today with no answer and was unable to leave a vm message.    Treatment Phase Abnormal Scans  Acuity Level 1  Time Spent with Patient 15

## 2015-07-19 ENCOUNTER — Telehealth: Payer: Self-pay | Admitting: *Deleted

## 2015-07-19 NOTE — Telephone Encounter (Signed)
Oncology Nurse Navigator Documentation  Oncology Nurse Navigator Flowsheets 07/19/2015  Navigator Encounter Type Telephone/I called and spoke with Mr. Grill today.  I asked if he would like to receive tx.  He stated he does not want any treatment.  I listened as he explained.  I did ask that he talk with his PCP about not receiving tx to get the care he needed.  I will update Dr. Julien Nordmann.   Telephone Outgoing Call  Treatment Phase Abnormal Scans  Acuity Level 1  Time Spent with Patient 15

## 2015-09-11 ENCOUNTER — Inpatient Hospital Stay (HOSPITAL_COMMUNITY)
Admission: EM | Admit: 2015-09-11 | Discharge: 2015-10-02 | DRG: 682 | Disposition: E | Payer: Commercial Managed Care - HMO | Attending: Family Medicine | Admitting: Family Medicine

## 2015-09-11 ENCOUNTER — Emergency Department (HOSPITAL_COMMUNITY): Payer: Commercial Managed Care - HMO

## 2015-09-11 ENCOUNTER — Encounter (HOSPITAL_COMMUNITY): Payer: Self-pay | Admitting: Emergency Medicine

## 2015-09-11 DIAGNOSIS — C4490 Unspecified malignant neoplasm of skin, unspecified: Secondary | ICD-10-CM | POA: Diagnosis present

## 2015-09-11 DIAGNOSIS — E43 Unspecified severe protein-calorie malnutrition: Secondary | ICD-10-CM | POA: Diagnosis present

## 2015-09-11 DIAGNOSIS — R7989 Other specified abnormal findings of blood chemistry: Secondary | ICD-10-CM | POA: Diagnosis present

## 2015-09-11 DIAGNOSIS — Z87891 Personal history of nicotine dependence: Secondary | ICD-10-CM

## 2015-09-11 DIAGNOSIS — R627 Adult failure to thrive: Secondary | ICD-10-CM | POA: Diagnosis present

## 2015-09-11 DIAGNOSIS — I959 Hypotension, unspecified: Secondary | ICD-10-CM | POA: Diagnosis present

## 2015-09-11 DIAGNOSIS — C3492 Malignant neoplasm of unspecified part of left bronchus or lung: Secondary | ICD-10-CM | POA: Diagnosis present

## 2015-09-11 DIAGNOSIS — D72829 Elevated white blood cell count, unspecified: Secondary | ICD-10-CM | POA: Diagnosis present

## 2015-09-11 DIAGNOSIS — N179 Acute kidney failure, unspecified: Secondary | ICD-10-CM | POA: Diagnosis not present

## 2015-09-11 DIAGNOSIS — J9601 Acute respiratory failure with hypoxia: Secondary | ICD-10-CM | POA: Diagnosis present

## 2015-09-11 DIAGNOSIS — L89101 Pressure ulcer of unspecified part of back, stage 1: Secondary | ICD-10-CM | POA: Diagnosis present

## 2015-09-11 DIAGNOSIS — D649 Anemia, unspecified: Secondary | ICD-10-CM | POA: Diagnosis present

## 2015-09-11 DIAGNOSIS — L8990 Pressure ulcer of unspecified site, unspecified stage: Secondary | ICD-10-CM | POA: Diagnosis present

## 2015-09-11 DIAGNOSIS — Z66 Do not resuscitate: Secondary | ICD-10-CM | POA: Diagnosis present

## 2015-09-11 DIAGNOSIS — I4891 Unspecified atrial fibrillation: Secondary | ICD-10-CM | POA: Diagnosis present

## 2015-09-11 DIAGNOSIS — R748 Abnormal levels of other serum enzymes: Secondary | ICD-10-CM | POA: Diagnosis present

## 2015-09-11 DIAGNOSIS — I9589 Other hypotension: Secondary | ICD-10-CM

## 2015-09-11 DIAGNOSIS — R778 Other specified abnormalities of plasma proteins: Secondary | ICD-10-CM | POA: Diagnosis present

## 2015-09-11 DIAGNOSIS — E46 Unspecified protein-calorie malnutrition: Secondary | ICD-10-CM | POA: Diagnosis present

## 2015-09-11 DIAGNOSIS — Z681 Body mass index (BMI) 19 or less, adult: Secondary | ICD-10-CM | POA: Diagnosis not present

## 2015-09-11 DIAGNOSIS — Z79899 Other long term (current) drug therapy: Secondary | ICD-10-CM

## 2015-09-11 DIAGNOSIS — R531 Weakness: Secondary | ICD-10-CM | POA: Diagnosis not present

## 2015-09-11 DIAGNOSIS — I248 Other forms of acute ischemic heart disease: Secondary | ICD-10-CM | POA: Diagnosis present

## 2015-09-11 DIAGNOSIS — R64 Cachexia: Secondary | ICD-10-CM | POA: Diagnosis present

## 2015-09-11 DIAGNOSIS — L89151 Pressure ulcer of sacral region, stage 1: Secondary | ICD-10-CM | POA: Diagnosis present

## 2015-09-11 DIAGNOSIS — E860 Dehydration: Secondary | ICD-10-CM | POA: Diagnosis present

## 2015-09-11 DIAGNOSIS — C3490 Malignant neoplasm of unspecified part of unspecified bronchus or lung: Secondary | ICD-10-CM | POA: Diagnosis present

## 2015-09-11 LAB — COMPREHENSIVE METABOLIC PANEL
ALT: 14 U/L — ABNORMAL LOW (ref 17–63)
AST: 29 U/L (ref 15–41)
Albumin: 1.2 g/dL — ABNORMAL LOW (ref 3.5–5.0)
Alkaline Phosphatase: 123 U/L (ref 38–126)
Anion gap: 13 (ref 5–15)
BUN: 72 mg/dL — ABNORMAL HIGH (ref 6–20)
CO2: 28 mmol/L (ref 22–32)
Calcium: 7.7 mg/dL — ABNORMAL LOW (ref 8.9–10.3)
Chloride: 97 mmol/L — ABNORMAL LOW (ref 101–111)
Creatinine, Ser: 2.42 mg/dL — ABNORMAL HIGH (ref 0.61–1.24)
GFR calc Af Amer: 28 mL/min — ABNORMAL LOW (ref >60)
GFR calc non Af Amer: 24 mL/min — ABNORMAL LOW (ref >60)
Glucose, Bld: 73 mg/dL (ref 65–99)
Potassium: 3.9 mmol/L (ref 3.5–5.1)
Sodium: 138 mmol/L (ref 135–145)
Total Bilirubin: 2.7 mg/dL — ABNORMAL HIGH (ref 0.3–1.2)
Total Protein: 7 g/dL (ref 6.5–8.1)

## 2015-09-11 LAB — CBC WITH DIFFERENTIAL/PLATELET
Basophils Absolute: 0 10*3/uL (ref 0.0–0.1)
Basophils Relative: 0 %
Eosinophils Absolute: 0 10*3/uL (ref 0.0–0.7)
Eosinophils Relative: 0 %
HCT: 24 % — ABNORMAL LOW (ref 39.0–52.0)
Hemoglobin: 8 g/dL — ABNORMAL LOW (ref 13.0–17.0)
Lymphocytes Relative: 6 %
Lymphs Abs: 0.6 10*3/uL — ABNORMAL LOW (ref 0.7–4.0)
MCH: 32.1 pg (ref 26.0–34.0)
MCHC: 33.3 g/dL (ref 30.0–36.0)
MCV: 96.4 fL (ref 78.0–100.0)
Monocytes Absolute: 0.3 10*3/uL (ref 0.1–1.0)
Monocytes Relative: 3 %
Neutro Abs: 10.6 10*3/uL — ABNORMAL HIGH (ref 1.7–7.7)
Neutrophils Relative %: 92 %
Platelets: 115 10*3/uL — ABNORMAL LOW (ref 150–400)
RBC: 2.49 MIL/uL — ABNORMAL LOW (ref 4.22–5.81)
RDW: 20 % — ABNORMAL HIGH (ref 11.5–15.5)
WBC: 11.5 10*3/uL — ABNORMAL HIGH (ref 4.0–10.5)

## 2015-09-11 LAB — PREPARE RBC (CROSSMATCH)

## 2015-09-11 LAB — MAGNESIUM: Magnesium: 2.3 mg/dL (ref 1.7–2.4)

## 2015-09-11 LAB — ABO/RH: ABO/RH(D): O POS

## 2015-09-11 LAB — TROPONIN I
Troponin I: 0.07 ng/mL — ABNORMAL HIGH (ref <0.031)
Troponin I: 0.07 ng/mL — ABNORMAL HIGH (ref <0.031)

## 2015-09-11 LAB — LACTIC ACID, PLASMA: Lactic Acid, Venous: 1.8 mmol/L (ref 0.5–2.0)

## 2015-09-11 MED ORDER — ADENOSINE 6 MG/2ML IV SOLN
INTRAVENOUS | Status: AC
  Filled 2015-09-11: qty 2

## 2015-09-11 MED ORDER — ONDANSETRON HCL 4 MG/2ML IJ SOLN: 4.0000 mg | Freq: Four times a day (QID) | INTRAMUSCULAR | Status: DC | PRN

## 2015-09-11 MED ORDER — SODIUM CHLORIDE 0.9 % IV SOLN: Freq: Once | INTRAVENOUS | Status: DC

## 2015-09-11 MED ORDER — HEPARIN SODIUM (PORCINE) 5000 UNIT/ML IJ SOLN: 5000.0000 [IU] | Freq: Three times a day (TID) | INTRAMUSCULAR | Status: DC

## 2015-09-11 MED ORDER — ALBUTEROL SULFATE HFA 108 (90 BASE) MCG/ACT IN AERS: 2.0000 | INHALATION_SPRAY | Freq: Four times a day (QID) | RESPIRATORY_TRACT | Status: DC | PRN

## 2015-09-11 MED ORDER — DILTIAZEM HCL 100 MG IV SOLR
5.0000 mg/h | INTRAVENOUS | Status: DC
  Administered 2015-09-11: 5 mg/h via INTRAVENOUS
  Filled 2015-09-11: qty 100

## 2015-09-11 MED ORDER — MORPHINE SULFATE (PF) 2 MG/ML IV SOLN
2.0000 mg | Freq: Once | INTRAVENOUS | Status: AC
  Administered 2015-09-11: 2 mg via INTRAVENOUS
  Filled 2015-09-11: qty 1

## 2015-09-11 MED ORDER — SODIUM CHLORIDE 0.9 % IV BOLUS (SEPSIS)
1000.0000 mL | Freq: Once | INTRAVENOUS | Status: AC
  Administered 2015-09-11: 1000 mL via INTRAVENOUS

## 2015-09-11 MED ORDER — DILTIAZEM HCL ER COATED BEADS 180 MG PO CP24: 180.0000 mg | ORAL_CAPSULE | Freq: Every day | ORAL | Status: DC

## 2015-09-11 MED ORDER — POLYETHYLENE GLYCOL 3350 17 G PO PACK: 17.0000 g | PACK | Freq: Every day | ORAL | Status: DC | PRN

## 2015-09-11 MED ORDER — MORPHINE SULFATE (PF) 2 MG/ML IV SOLN: 1.0000 mg | INTRAVENOUS | Status: DC | PRN

## 2015-09-11 MED ORDER — ALPRAZOLAM 0.25 MG PO TABS: 0.2500 mg | ORAL_TABLET | Freq: Three times a day (TID) | ORAL | Status: DC | PRN

## 2015-09-11 MED ORDER — ACETAMINOPHEN 325 MG PO TABS: 650.0000 mg | ORAL_TABLET | ORAL | Status: DC | PRN

## 2015-09-11 MED ORDER — ALBUTEROL SULFATE (2.5 MG/3ML) 0.083% IN NEBU: 2.5000 mg | INHALATION_SOLUTION | Freq: Four times a day (QID) | RESPIRATORY_TRACT | Status: DC | PRN

## 2015-09-11 MED ORDER — ADENOSINE 6 MG/2ML IV SOLN
6.0000 mg | Freq: Once | INTRAVENOUS | Status: AC
  Administered 2015-09-11: 6 mg via INTRAVENOUS
  Filled 2015-09-11: qty 2

## 2015-09-11 MED ORDER — SODIUM CHLORIDE 0.9 % IV SOLN
INTRAVENOUS | Status: DC
  Administered 2015-09-11: 150 mL/h via INTRAVENOUS

## 2015-09-11 MED ORDER — DILTIAZEM HCL 25 MG/5ML IV SOLN
10.0000 mg | Freq: Once | INTRAVENOUS | Status: AC
  Administered 2015-09-11: 10 mg via INTRAVENOUS
  Filled 2015-09-11: qty 5

## 2015-09-11 MED ORDER — PANTOPRAZOLE SODIUM 40 MG PO TBEC: 40.0000 mg | DELAYED_RELEASE_TABLET | Freq: Every day | ORAL | Status: DC

## 2015-09-11 NOTE — ED Notes (Signed)
Crash cart and MD at bedside to give adenosine

## 2015-09-11 NOTE — ED Notes (Signed)
Pt didn't convert, per MD give bolus to get pressure up before Cardizem drip.  Fluids running wide open.

## 2015-09-11 NOTE — Progress Notes (Signed)
[facility] consulted for possible home hospice needs.  EDCM spoke to patient and his Daughter Shane Campos at bedside.  [name]'s phone number [phone].  Patient lives at home with his son Shane Campos.  Patient does not have any equipment at home.  Per patient's daughter, "He just lays in the bed."  [facility] asked patient when was the last time he walked?  Patient stated, "It's been a long time."  Patient is also unsure of when the last time he has eaten.  Patient is listed as having [employer].  Patient's daughter was relieved of this as they thought he didn't have any insurance.  EDCM provided patient with list of home hospice agencies in [facility].  EDCM also provided a list of Residential hospice facilities.  Patient reports he would like to go home.  If Palliative consult is placed, patient's daughter would like to meet with Palliative care doctor and patient to discuss plan of care.    Patient's daughter would like to be notified of what room patient is going to this evening.  Doesn't matter how late it is, please call patient's daughter Shane Campos when patient is going to unit [phone].

## 2015-09-11 NOTE — ED Notes (Signed)
Md stated patient can have something to drink.

## 2015-09-11 NOTE — ED Notes (Signed)
Requested patient to urinate. 

## 2015-09-11 NOTE — H&P (Signed)
[facility] History and Physical  BRAYDAN MARRIOTT MEQ:[id number] DOB: 1940-09-23 DOA: 09/06/2015  Referring physician: ED physician PCP: Kandice Hams, MD  Specialists: Dr. Julien Nordmann (oncology), Dr. Sondra Come (radiation oncology)   Chief Complaint:  Generalized weakness   HPI: Shane Campos is a 75 y.o. male with PMH of squamous cell carcinoma of the left lung who is brought into the ED by family for evaluation of progressive weakness and failure to thrive. Patient was found to have a large cavitary lesion in the upper left lung is diagnosed with squamous cell carcinoma of the lung in September 2016. He had been evaluated by Dr. Julien Nordmann of oncology and had initially agreed to treatment before deciding against it. He never did follow through with staging MRI and PET and has elected to stop following with oncology. Per report of his family, he has had increasingly poor oral intake since the cancer diagnosis, worsening rapidly over the past 3 or 4 months. Family reports that he has taken only sips of water and an occasional bite of potato chip or toast for the past 3 or 4 months and has become essentially bedbound. Family reports encouraging the patient to seek medical care for the past several months, but he has refused to do so, even threatening that he will stop talking to them if they bring him into the hospital. They believe that he had gotten to the point where he was too weak to resist their efforts, and agreed to come to the ED for evaluation today.  In ED, patient was found to be saturating in the 80s on room air, with blood pressure in the 80/60 range, and in atrial fibrillation with tachycardia to the 160s. Chest x-ray features the known large left apical lung mass with surrounding inflammation that could represent interstitial tumor spread. CBC features a leukocytosis to 11,500 and toxic granulation. Hemoglobin is 8.0, down from a baseline of approximately 10. Platelet count is 115,000.  CMP features and albumin of 1.2 and a serum creatinine of 2.4, up from an apparent baseline of 0.8. Adenosine was administered in the ED without effect and this was followed by IV push of diltiazem. Patient remained in atrial fibrillation with heart rate in the 150s and diltiazem infusion was started. He was bolused with 1 L of normal saline. He will be admitted to the stepdown unit for ongoing evaluation and management of atrial fibrillation with RVR, AKI, and progressive weakness in the setting of adult failure to thrive and untreated lung cancer.  Where does patient live?   At home    Can patient participate in ADLs?  Barely     Review of Systems:   General: no fevers, chills, sweats. Loss of appetite, fatigue, wt loss HEENT: no blurry vision, hearing changes or sore throat Pulm: no cough, or wheeze. Dyspnea CV: no chest pain or palpitations Abd: no nausea, vomiting, abdominal pain, diarrhea, or constipation GU: no dysuria, hematuria, increased urinary frequency, or urgency  Ext: no leg edema Neuro: no focal weakness, numbness, or tingling, no vision change or hearing loss Skin: no rash, no wounds MSK: No muscle spasm, no deformity, no red, hot, or swollen joint Heme: No easy bruising or bleeding Travel history: No recent long distant travel    Allergy: No Known Allergies  Past Medical History  Diagnosis Date  . Cancer ([place])     skin ca    Past Surgical History  Procedure Laterality Date  . Video bronchoscopy Bilateral 02/24/2015    Procedure:  VIDEO BRONCHOSCOPY WITHOUT FLUORO;  Surgeon: Rush Farmer, MD;  Location: [facility];  Service: Cardiopulmonary;  Laterality: Bilateral;    Social History:  reports that he quit smoking about 6 months ago. He does not have any smokeless tobacco history on file. He reports that he does not drink alcohol or use illicit drugs.  Family History: History reviewed. No pertinent family history.   Prior to Admission medications   Medication  Sig Start Date End Date Taking? Authorizing Provider  albuterol (PROVENTIL HFA;VENTOLIN HFA) 108 (90 BASE) MCG/ACT inhaler Inhale 2 puffs into the lungs every 6 (six) hours as needed for wheezing or shortness of breath. 02/28/15   Domenic Polite, MD  ALPRAZolam Duanne Moron) 0.25 MG tablet Take 1 tablet (0.25 mg total) by mouth at bedtime as needed for anxiety. 03/07/15   Curt Bears, MD  cephALEXin (KEFLEX) 500 MG capsule Take 1 capsule (500 mg total) by mouth 4 (four) times daily. For 4 weeks Patient not taking: Reported on 09/17/2015 02/28/15   Domenic Polite, MD  diltiazem (CARDIZEM CD) 180 MG 24 hr capsule Take 1 capsule (180 mg total) by mouth daily. 02/28/15   Domenic Polite, MD  metoprolol tartrate (LOPRESSOR) 25 MG tablet Take 1 tablet (25 mg total) by mouth 2 (two) times daily. 02/28/15   Domenic Polite, MD  pantoprazole (PROTONIX) 40 MG tablet Take 1 tablet (40 mg total) by mouth daily. 02/28/15   Domenic Polite, MD  polyethylene glycol (MIRALAX / GLYCOLAX) packet Take 17 g by mouth daily as needed. 02/28/15   Domenic Polite, MD  saccharomyces boulardii (FLORASTOR) 250 MG capsule Take 1 capsule (250 mg total) by mouth 2 (two) times daily. For 4 weeks 02/28/15   Domenic Polite, MD    Physical Exam: Filed Vitals:   09/05/2015 2100 09/27/2015 2130 09/17/2015 2140 09/25/2015 2200  BP: '96/67 97/64 87/73 '$ 91/61  Pulse: 157  158   Resp: '17 18 23 26  '$ SpO2: 89%  96%    General: Not in acute distress. Cachectic, pale HEENT:       Eyes: PERRL, EOMI, no scleral icterus or conjunctival pallor.       ENT: No discharge from the ears or nose, no pharyngeal ulcers.  Dry oral mucosa.        Neck: No JVD, no bruit, no appreciable mass. Ulcerated, crusted lesion on right neck with heaped up borders; similar lesion left temple Heme: No cervical adenopathy, no pallor Cardiac: Rate ~120 and irregular, No murmurs, No gallops or rubs. Pulm: Good air movement bilaterally. Coarse ronchi throughout. Abd: Soft, nondistended,  nontender, no rebound pain or gaurding, BS present. Ext: No LE edema bilaterally. 2+DP/PT pulse bilaterally. Musculoskeletal: No gross deformity, no red, hot, swollen joints   Skin: No rashes or wounds on exposed surfaces  Neuro: Alert, oriented X3, cranial nerves II-XII grossly intact. No focal findings Psych: Patient is not overtly psychotic, appropriate mood and affect.  Labs on Admission:  Basic Metabolic Panel:  Recent Labs Lab 09/23/2015 1952  NA 138  K 3.9  CL 97*  CO2 28  GLUCOSE 73  BUN 72*  CREATININE 2.42*  CALCIUM 7.7*  MG 2.3   Liver Function Tests:  Recent Labs Lab 09/19/2015 1952  AST 29  ALT 14*  ALKPHOS 123  BILITOT 2.7*  PROT 7.0  ALBUMIN 1.2*   No results for input(s): LIPASE, AMYLASE in the last 168 hours. No results for input(s): AMMONIA in the last 168 hours. CBC:  Recent Labs Lab 09/17/2015 1952  WBC 11.5*  NEUTROABS 10.6*  HGB 8.0*  HCT 24.0*  MCV 96.4  PLT 115*   Cardiac Enzymes:  Recent Labs Lab 09/23/2015 1952  TROPONINI 0.07*    BNP (last 3 results) No results for input(s): BNP in the last 8760 hours.  ProBNP (last 3 results) No results for input(s): PROBNP in the last 8760 hours.  CBG: No results for input(s): GLUCAP in the last 168 hours.  Radiological Exams on Admission: Dg Chest Portable 1 View  09/16/2015  CLINICAL DATA:  Lung cancer with progressive weakness fatigue and lethargy. EXAM: PORTABLE CHEST 1 VIEW COMPARISON:  02/26/2015 FINDINGS: External pacer pad all is noted. Persistent left upper lobe cavitary lesion and surrounding inflammation or interstitial spread of tumor. Small left pleural effusion persists. The right lung is relatively clear. The bony structures are grossly intact. IMPRESSION: Persistent large left apical lung mass with surrounding inflammation or interstitial spread of tumor. Persistent small left effusion. The right lung remains clear. Electronically Signed   By: Marijo Sanes M.D.   On: 09/14/2015  19:14    EKG: Independently reviewed.  Abnormal findings:  Atrial fibrillation with RVR (rate 159)    Assessment/Plan  1. Atrial fibrillation with RVR  - Did not respond to IVP adenosine or diltiazem in ED, now on diltiazem infusion, titrating  - CHADS-VASc is at least 2 for age, no on AC  - Titrate diltiazem drip, consider digoxin if needed    2. Hypotension  - No clear evidence of infection; suspected multifactorial with contributions from rapid rate and malnutrition - 1 L NS bolused in ED, continuing with NS at 150 cc/hr  - BP improved some with IVF bolus, will repeat prn  - Pt indicates that he does not want life support, family at the bedside support him in this decision    3. Lung cancer   - Left apical lung mass diagnosed as squamous cell carcinoma in October 2016  - Had been sent for MRI and PET to stage, but did not follow through with this  - Per oncology notes, no-showed for appts and indicated that he had decided against any treatment  - CXR on admission demonstrates mass with surrounding inflammation vs interval spread of tumor    4. AKI  - SCr 2.41 on admission, up from an apparent baseline of 0.7  - Suspect much of this is prerenal and secondary to hypotension  - Anticipate some improvement with IVF  - Repeat chem panel in am   5. Troponin elevation  - Troponin is 0.07 on admission  - No anginal complaints from pt  - Suspect this represents demand ischemia d/t the rapid rate  - Trend troponins  - Control HR as above    6. Adult failure to thrive, protein-calorie malnutrition - Per family's report, pt taking only occasional sips of water or rare bite of food despite their encouragement  - Wasting is evident on exam  - Albumin is 1.2 on admission  - Likely secondary to progressive malignant process  - Dietary consultation    DVT ppx: SQ Heparin     Code Status: DNR Family Communication:  Yes, patient's son and daughter at bed side Disposition Plan: Admit  to inpatient   Date of Service 09/14/2015    Vianne Bulls, MD [facility] Pager [phone]  If 7PM-7AM, please contact night-coverage [email or web address] Password [facility] 09/08/2015, 10:22 PM

## 2015-09-11 NOTE — ED Notes (Signed)
Per EMS pt dx with lung CA in Sept 2016 hasn't gotten any tx, poor po intake last several months and has gotten weaker son called EMS to come and get. BP 90/60 400cc fluids HR160, 110CB.

## 2015-09-11 NOTE — ED Notes (Signed)
Md called to bedside.

## 2015-09-11 NOTE — ED Notes (Signed)
MD at bedside. 

## 2015-09-11 NOTE — ED Notes (Signed)
Xray at bedside to do portable chest xray

## 2015-09-11 NOTE — ED Provider Notes (Signed)
CSN: [id number]     Arrival date & time 09/21/2015  1759 History   First MD Initiated Contact with Patient 09/25/2015 1814     Chief Complaint  Patient presents with  . Fatigue     (Consider location/radiation/quality/duration/timing/severity/associated sxs/prior Treatment) HPI  75 year old male brought in by EMS. Patient lives at home with his son. Son is at bedside. He states that patient has essentially been wasting away or the past several months. He has consistently refused to let son bring him to the hospital. Today, one of the patient's other sons visited. When he saw him he immediately called to have him brought to the ED. he has a past history of cancer. He has not followed up consistently as recommended. He has become increasingly weaker and emaciated. Very difficult to get him to eat or drink anything and has essentially had nothing more than some sips of water the past couple days.  Past Medical History  Diagnosis Date  . Cancer ([place])     skin ca   Past Surgical History  Procedure Laterality Date  . Video bronchoscopy Bilateral 02/24/2015    Procedure: VIDEO BRONCHOSCOPY WITHOUT FLUORO;  Surgeon: Rush Farmer, MD;  Location: [facility];  Service: Cardiopulmonary;  Laterality: Bilateral;   History reviewed. No pertinent family history. Social History  Substance Use Topics  . Smoking status: Former Smoker    Quit date: 02/21/2015  . Smokeless tobacco: None  . Alcohol Use: No    Review of Systems  All systems reviewed and negative, other than as noted in HPI.   Allergies  Review of patient's allergies indicates no known allergies.  Home Medications   Prior to Admission medications   Medication Sig Start Date End Date Taking? Authorizing Provider  albuterol (PROVENTIL HFA;VENTOLIN HFA) 108 (90 BASE) MCG/ACT inhaler Inhale 2 puffs into the lungs every 6 (six) hours as needed for wheezing or shortness of breath. 02/28/15   Domenic Polite, MD  ALPRAZolam Duanne Moron) 0.25  MG tablet Take 1 tablet (0.25 mg total) by mouth at bedtime as needed for anxiety. 03/07/15   Curt Bears, MD  cephALEXin (KEFLEX) 500 MG capsule Take 1 capsule (500 mg total) by mouth 4 (four) times daily. For 4 weeks Patient not taking: Reported on 09/15/2015 02/28/15   Domenic Polite, MD  diltiazem (CARDIZEM CD) 180 MG 24 hr capsule Take 1 capsule (180 mg total) by mouth daily. 02/28/15   Domenic Polite, MD  metoprolol tartrate (LOPRESSOR) 25 MG tablet Take 1 tablet (25 mg total) by mouth 2 (two) times daily. 02/28/15   Domenic Polite, MD  pantoprazole (PROTONIX) 40 MG tablet Take 1 tablet (40 mg total) by mouth daily. 02/28/15   Domenic Polite, MD  polyethylene glycol (MIRALAX / GLYCOLAX) packet Take 17 g by mouth daily as needed. 02/28/15   Domenic Polite, MD  saccharomyces boulardii (FLORASTOR) 250 MG capsule Take 1 capsule (250 mg total) by mouth 2 (two) times daily. For 4 weeks 02/28/15   Domenic Polite, MD   BP 93/69 mmHg  Pulse 157  Temp(Src)   Resp 23  SpO2 90% Physical Exam  Constitutional: He appears well-developed. No distress.  Laying in bed. Cachectic. Appears malnourished and very frail.   HENT:  Head: Normocephalic.  Quarter sized ulceration to L temporal region with some purulent drainage  Eyes: Conjunctivae are normal. Right eye exhibits no discharge. Left eye exhibits no discharge.  Neck: Neck supple.  Cardiovascular: Normal rate, regular rhythm and normal heart sounds.  Exam reveals  no gallop and no friction rub.   No murmur heard. Pulmonary/Chest: Effort normal and breath sounds normal. No respiratory distress.  Abdominal: Soft. He exhibits no distension. There is no tenderness.  Musculoskeletal: He exhibits edema. He exhibits no tenderness.  Mild asymetric LE edema  Neurological: He is alert.  Skin: Skin is warm and dry.  Psychiatric: He has a normal mood and affect. His behavior is normal. Thought content normal.  Nursing note and vitals reviewed.   ED Course   Procedures (including critical care time) Labs Review Labs Reviewed  COMPREHENSIVE METABOLIC PANEL - Abnormal; Notable for the following:    Chloride 97 (*)    BUN 72 (*)    Creatinine, Ser 2.42 (*)    Calcium 7.7 (*)    Albumin 1.2 (*)    ALT 14 (*)    Total Bilirubin 2.7 (*)    GFR calc non Af Amer 24 (*)    GFR calc Af Amer 28 (*)    All other components within normal limits  CBC WITH DIFFERENTIAL/PLATELET - Abnormal; Notable for the following:    WBC 11.5 (*)    RBC 2.49 (*)    Hemoglobin 8.0 (*)    HCT 24.0 (*)    RDW 20.0 (*)    Platelets 115 (*)    Neutro Abs 10.6 (*)    Lymphs Abs 0.6 (*)    All other components within normal limits  TROPONIN I - Abnormal; Notable for the following:    Troponin I 0.07 (*)    All other components within normal limits  LACTIC ACID, PLASMA  MAGNESIUM  LACTIC ACID, PLASMA  URINALYSIS, ROUTINE W REFLEX MICROSCOPIC (NOT AT [facility])    Imaging Review Dg Chest Portable 1 View  09/27/2015  CLINICAL DATA:  Lung cancer with progressive weakness fatigue and lethargy. EXAM: PORTABLE CHEST 1 VIEW COMPARISON:  02/26/2015 FINDINGS: External pacer pad all is noted. Persistent left upper lobe cavitary lesion and surrounding inflammation or interstitial spread of tumor. Small left pleural effusion persists. The right lung is relatively clear. The bony structures are grossly intact. IMPRESSION: Persistent large left apical lung mass with surrounding inflammation or interstitial spread of tumor. Persistent small left effusion. The right lung remains clear. Electronically Signed   By: Marijo Sanes M.D.   On: 09/03/2015 19:14   I have personally reviewed and evaluated these images and lab results as part of my medical decision-making.   EKG Interpretation   Date/Time:  Monday September 11 2015 18:25:06 EDT Ventricular Rate:  159 PR Interval:  192 QRS Duration: 90 QT Interval:  325 QTC Calculation: 529 R Axis:   88 Text Interpretation:  atrial  fibrillation with RVR  diffuse repolarization  abnormality Anterior infarct, old Confirmed by Barbarann Kelly  MD, [place] ([id number])  on 09/07/2015 6:43:05 PM      MDM   Final diagnoses:  Failure to thrive in adult  Dehydration  Lung Cancer Atrial fibrillation with Rapid Ventricular Rate Severe Malnutrition  75yM with untreated lung CA. Has not been following up with oncology as recommended because scared and unsure of best course of action. Essentially wasting away. Very dehydrated. IVF. Afib with RVR. He has a hx of the same. Cardizem. Troponin mildly elevated. Suspect demand ischemia. Denies pain. Tried to address goals of care. DNR/DNI, but pt unsure beyond this. Will admit. Needs to have discussion with oncology in terms of prognosis and treatment options. Is agreeable to admission, hydration, medical tx of afib and treatment of other  potentially reversible conditions.     Virgel Manifold, MD 09/20/15 1359

## 2015-09-11 NOTE — ED Notes (Signed)
Bed: WA09 Expected date:  Expected time:  Means of arrival:  Comments: EMS/lung ca/weakness

## 2015-09-11 NOTE — Progress Notes (Signed)
Admission nurse reached out to [place]. She states patient and family are interested in home hospice. CSW made Nurse CM aware. She states she will speak with patient and family at bedside, and provide resources.   Willette Brace [phone] ED CSW 09/05/2015 10:02 PM

## 2015-09-12 DIAGNOSIS — E46 Unspecified protein-calorie malnutrition: Secondary | ICD-10-CM

## 2015-09-12 DIAGNOSIS — C4490 Unspecified malignant neoplasm of skin, unspecified: Secondary | ICD-10-CM

## 2015-09-12 DIAGNOSIS — L8990 Pressure ulcer of unspecified site, unspecified stage: Secondary | ICD-10-CM | POA: Diagnosis present

## 2015-09-12 LAB — MRSA PCR SCREENING: MRSA BY PCR: NEGATIVE

## 2015-09-12 LAB — T4, FREE: FREE T4: 0.92 ng/dL (ref 0.61–1.12)

## 2015-09-15 LAB — TYPE AND SCREEN
ABO/RH(D): O POS
ANTIBODY SCREEN: NEGATIVE
Unit division: 0

## 2015-10-02 NOTE — Care Management Note (Signed)
Case Management Note  Patient Details  Name: Shane Campos MRN: [id number] Date of Birth: January 31, 1941  Subjective/Objective:     A.fib               Action/Plan: expired   Expected Discharge Date:   (unknown)               Expected Discharge Plan:  Expired  In-House Referral:     Discharge planning Services  CM Consult  Post Acute Care Choice:  NA Choice offered to:  NA  DME Arranged:    DME Agency:     HH Arranged:    [place] Agency:     Status of Service:  Completed, signed off  Medicare Important Message Given:    Date Medicare IM Given:    Medicare IM give by:    Date Additional Medicare IM Given:    Additional Medicare Important Message give by:     If discussed at [place] of Stay Meetings, dates discussed:    Additional Comments:  Leeroy Cha, RN 09/13/2015, 1:03 PM

## 2015-10-02 NOTE — Discharge Summary (Signed)
Physician Discharge Summary  Patient ID: Shane Campos MRN: [id number] DOB/AGE: 1940-09-07 75 y.o.  Admit date: 09/27/2015 Discharge date: 10-07-15  Admission Diagnoses:  Principal Problem:   Acute respiratory failure with hypoxia (HCC) Active Problems:   Atrial fibrillation with RVR (HCC)   Squamous cell carcinoma lung (HCC)   Skin cancer   Protein calorie malnutrition (HCC)   Failure to thrive in adult   Normocytic anemia   Hypotension   AKI (acute kidney injury) (HCC)   Dehydration   Elevated troponin   Pressure ulcer  Discharge Diagnoses:  Principal Problem:   Acute respiratory failure with hypoxia (HCC) Active Problems:   Atrial fibrillation with RVR (HCC)   Squamous cell carcinoma lung (HCC)   Skin cancer   Protein calorie malnutrition (HCC)   Failure to thrive in adult   Normocytic anemia   Hypotension   AKI (acute kidney injury) (HCC)   Dehydration   Elevated troponin   Pressure ulcer   Discharged Condition: Deceased  Hospital Course: Shane Campos is a 75 yom with large left apical cavitary lung lesion that was determined to be non-small cell lung cancer in September 2016. He had been evaluated by oncology at that time and there were some plans for treatment, but per oncology notes, he elected to not follow-up for staging and went on to decline treatment. He has been residing with his son, and since the diagnosis has had very poor, and worsening, oral intake. He has experienced progressive weight loss, weakness, and lethargy over the past several months. His son and daughter who were at the bedside during admission report that the patient has repeatedly declined any medical evaluation over this interval despite their urging.  His daughter reports that the patient threatened to never speak to her again if she called 911 or took him to the hospital. On the day of admission, the patient was reportedly visited by another son, a firefighter who had not seem the patient  in several weeks. This son was reportedly shocked to find his father in such a poor physical state and demanded that he be taken to the ED. By this time, the patient was reportedly too weak to oppose them.   The patient presented in atrial fibrillation with rapid rate and hypoxic respiratory failure. He was emaciated and unkempt with renal failure, pressure wound, large ulcerated skin cancers, and pallor. Fluids were bolused and a dose of adenosine was given without any appreciable effect.  Diltiazem bolus was given and he was started on diltiazem infusion.  HR remained in 140 - 160 range and attempts to titrate up the infusion rate were complicated by hypotension. Candid discussion was held with the patient and his son and daughter. The patient reported that he did not want CPR, mechanical ventilation, or life-support. He voiced a desire to go home, knowing that he was dying. His family at the bedside supported him in this decision and inquired about home hospice. Case management met with the family briefly to discuss hospice options.   The patient was admitted to stepdown unit on diltiazem infusion. Shortly after his arrival on the unit, his oxygen saturation began to decline briskly despite increases in FiO2. He was placed on a NRB, but O2 saturations failed to improve. This was followed by slowing of the HR, eventually becoming bradycardic to the 30s as blood pressure also began to decline. Spontaneous respirations ceased and the patient expired.   On behalf of all of [place] at [facility] who  had the opportunity to participate in the care of this fine gentlemen, our thoughts and prayers are with his family and friends during this difficult time of mourning.   Disposition:  Expired  Signed: Ilene Qua Shane Campos 09/21/15, 6:40 PM

## 2015-10-02 DEATH — deceased

## 2017-03-24 IMAGING — CT CT ANGIO CHEST
2 of 10 series · 18 of 46 positions shown · IV contrast (OMNI)
Comparison: Radiograph dated [DATE]

CLINICAL DATA: 74-year-old male with shortness of breath under
chest pain and hemoptysis.

EXAM:
CT ANGIOGRAPHY CHEST WITH CONTRAST
TECHNIQUE: Multidetector CT imaging of the chest was performed using the
standard protocol during bolus administration of intravenous
contrast. Multiplanar CT image reconstructions and MIPs were
obtained to evaluate the vascular anatomy.
CONTRAST:  70mL OMNIPAQUE IOHEXOL 350 MG/ML SOLN

[Series 7: thins · axial · 0.67mm/px · z∈[-384,-26]mm · 15 of 396 slices shown]
[im 19/396  lung]
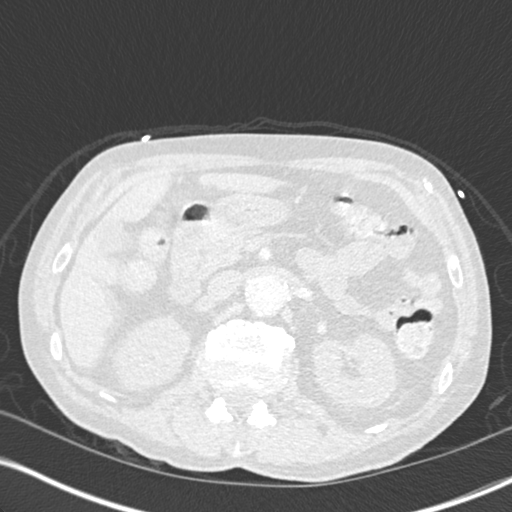
[im 57/396  soft-tissue]
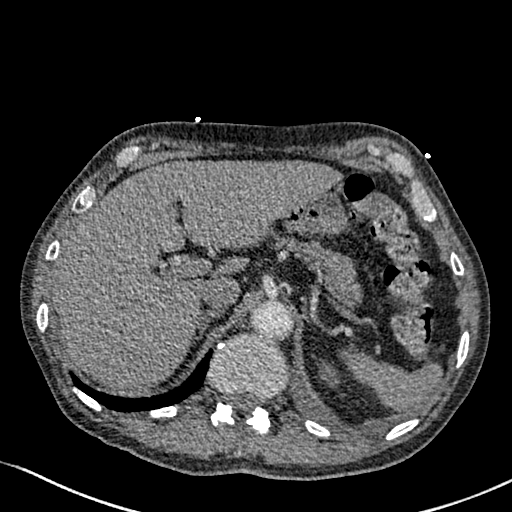
[im 76/396  lung]
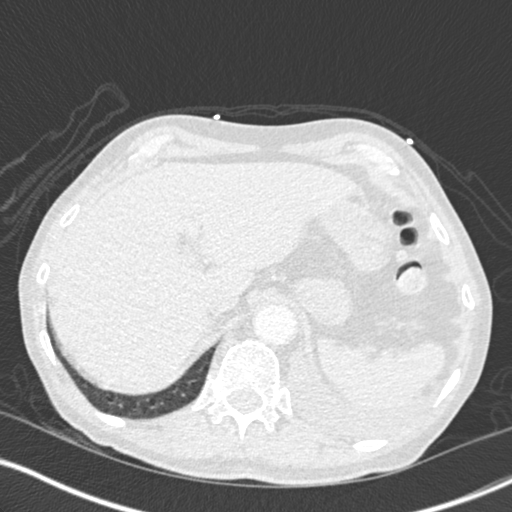
[im 95/396  soft-tissue]
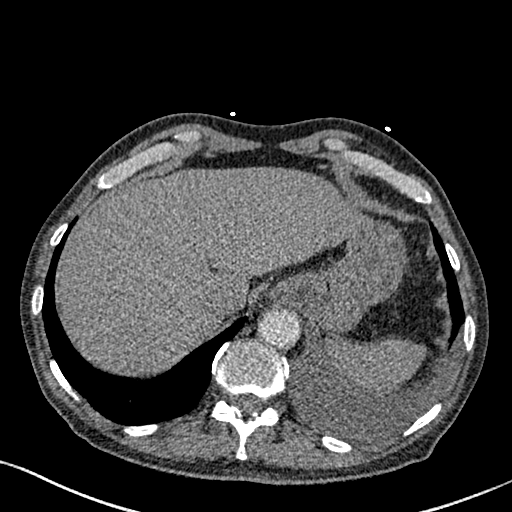
[im 132/396  lung]
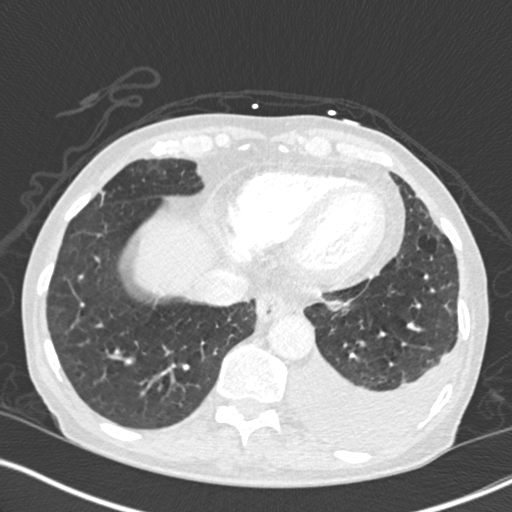
[im 151/396  soft-tissue]
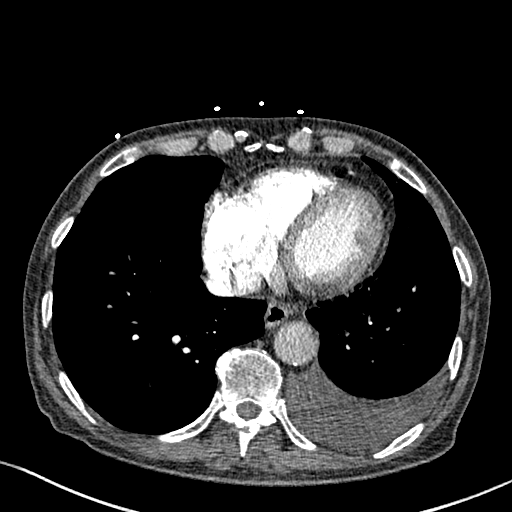
[im 170/396  lung]
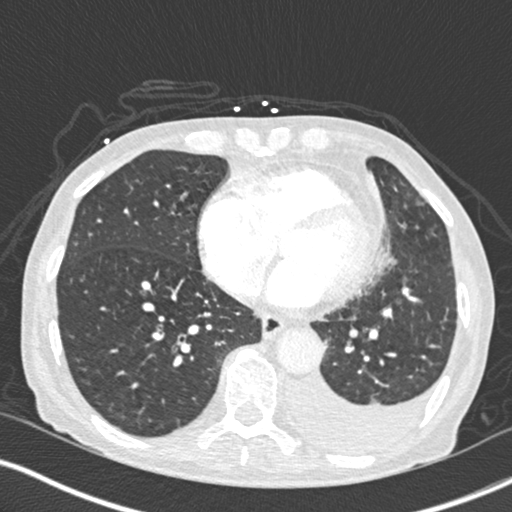
[im 207/396  soft-tissue]
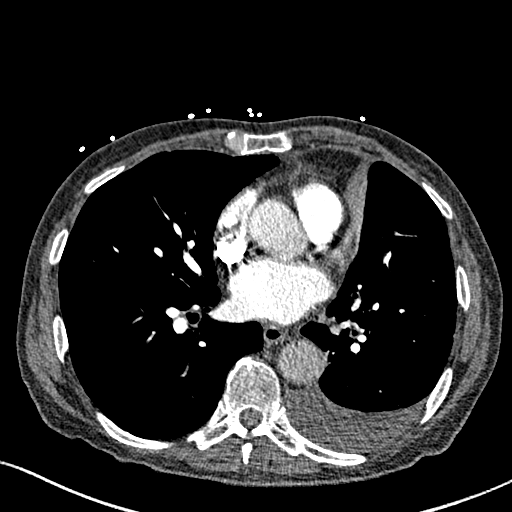
[im 226/396  lung]
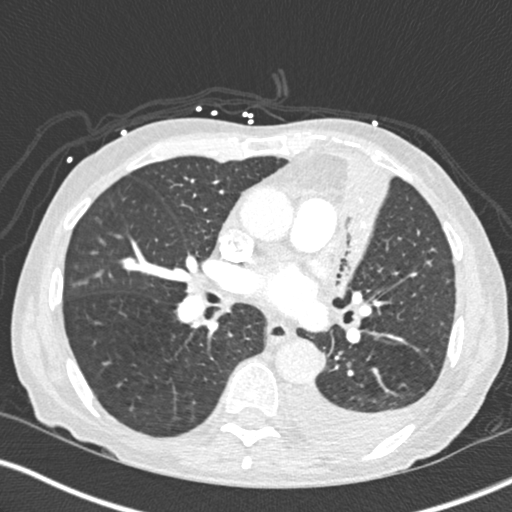
[im 245/396  soft-tissue]
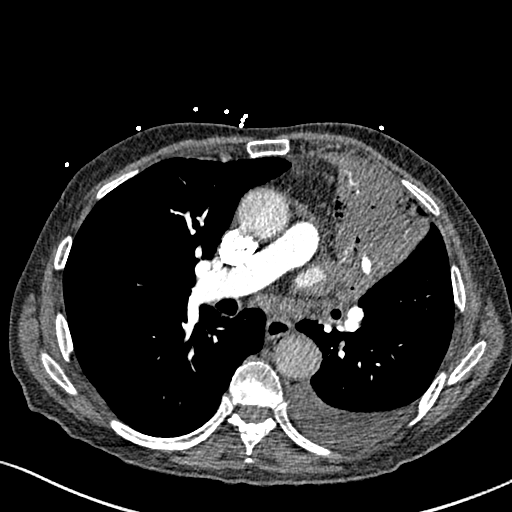
[im 283/396  lung]
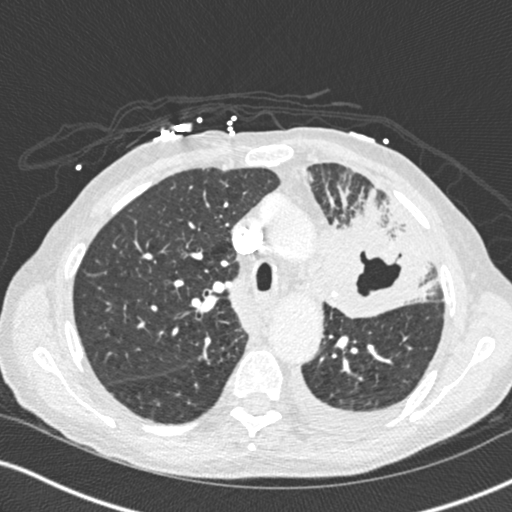
[im 301/396  soft-tissue]
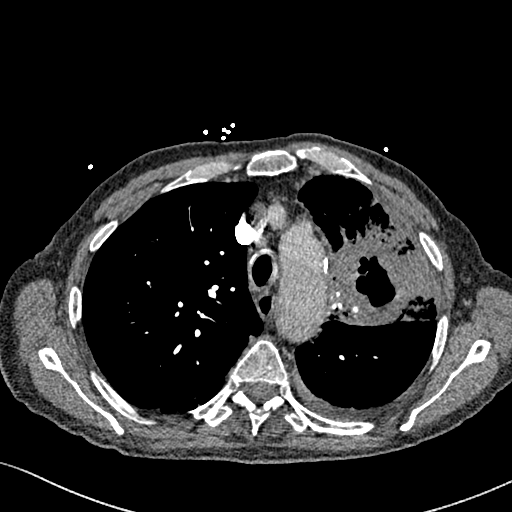
[im 320/396  lung]
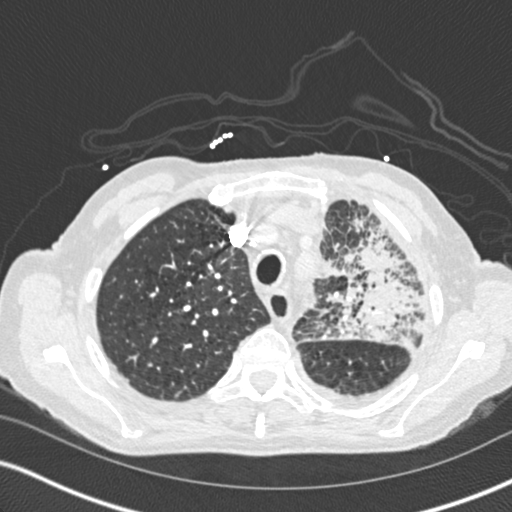
[im 358/396  soft-tissue]
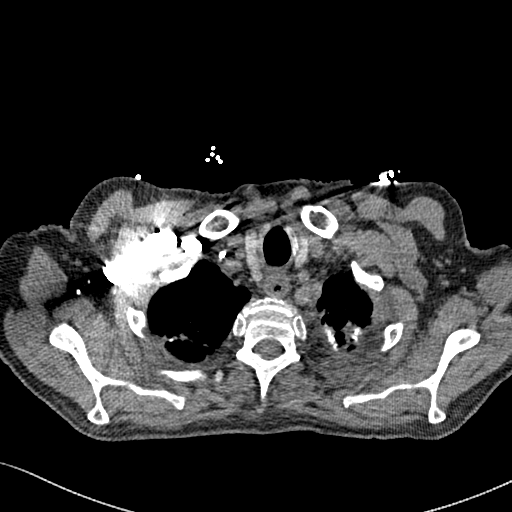
[im 377/396  lung]
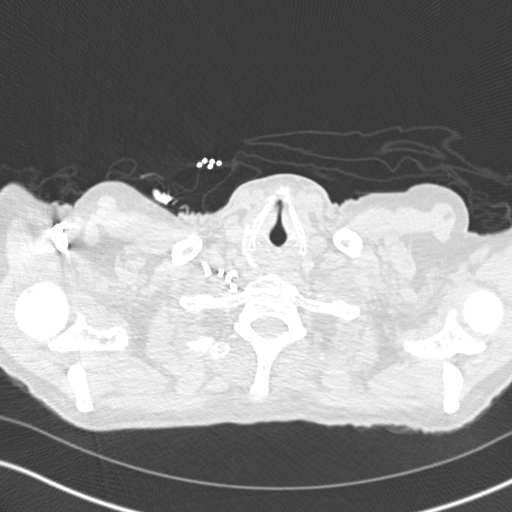

[Series 9: coronal mpr · coronal · 0.77mm/px · 3 of 115 slices shown]
[im 29/115  soft-tissue]
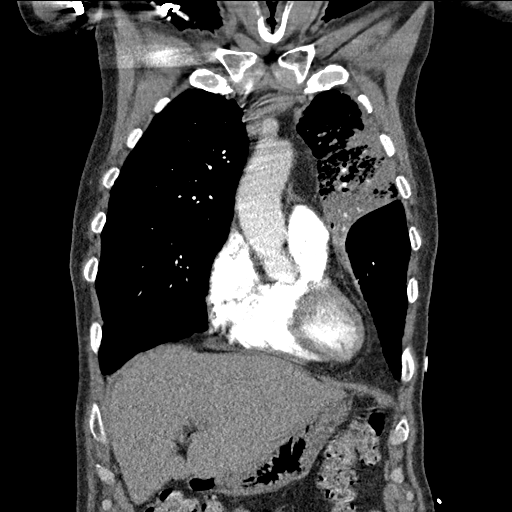
[im 58/115  soft-tissue]
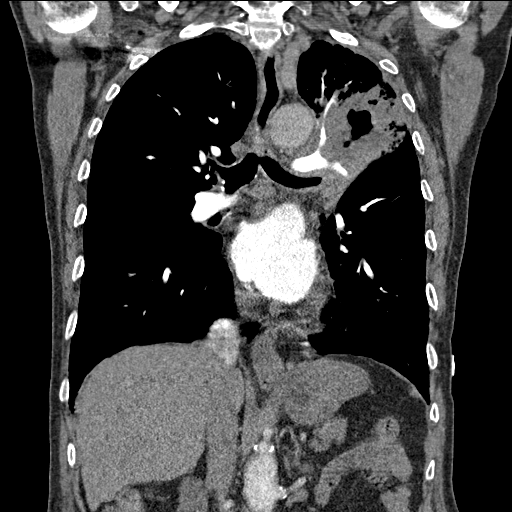
[im 86/115  soft-tissue]
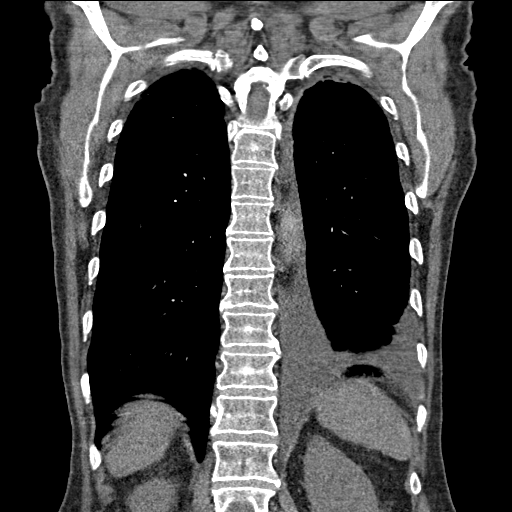

[18 of 46 positions shown; findings below may reference images not displayed]

FINDINGS: There is a 7.6 x 6.7 cm masslike consolidation with central
cavitation in the left infrahilar/ lingula region. The mass extends
from the pleural surface into the left hilum. There is encasement
and narrowing of the left upper lobe bronchus as well as encasement
of the left upper lobe pulmonary artery branch. There is diffuse
opacification of the left upper lobe with air bronchograms. There is
a small left pleural effusion.

There is a focal area of nodularity with "Tree in bud appearance" in
the right upper lobe (series 6, image 43 -45) the remainder of the
lungs are clear. There is no pneumothorax.

The thoracic aorta appears unremarkable. There is no CT evidence of
pulmonary artery embolism. Top-normal subcarinal lymph nodes. There
is no cardiomegaly or pericardial effusion. The thyroid gland and
esophagus appear grossly unremarkable.

There is no axillary adenopathy. The chest wall soft tissues appear
unremarkable. The osseous structures appear intact. There is no
evidence of bony erosion or acute fracture. Mild degenerative
changes of the spine.

Review of the MIP images confirms the above findings.
IMPRESSION: No CT evidence of pulmonary embolism.

Left upper lobe/lingula cavitary mass with extension into the left
hilum and mediastinum. Differential includes a cavitary neoplasm or
an infectious process such as fungal infection, abscess, or TB.
Other etiologies are not excluded. Correlation with sputum cultures
and pulmonary consult is advised. Bronchoscopy may provide better
evaluation if clinically indicated.

Focal right upper lobe tree-in-bud nodular appearance compatible
with an infectious process.

Small left pleural effusion.

## 2017-03-29 IMAGING — CR DG CHEST 1V PORT
2 series · 2 of 2 positions shown · non-contrast
Comparison: CT chest 02/21/2015.  Chest radiograph 02/21/2015.

CLINICAL DATA: Dyspnea.

EXAM:
PORTABLE CHEST 1 VIEW

[AP (1 of 2)]
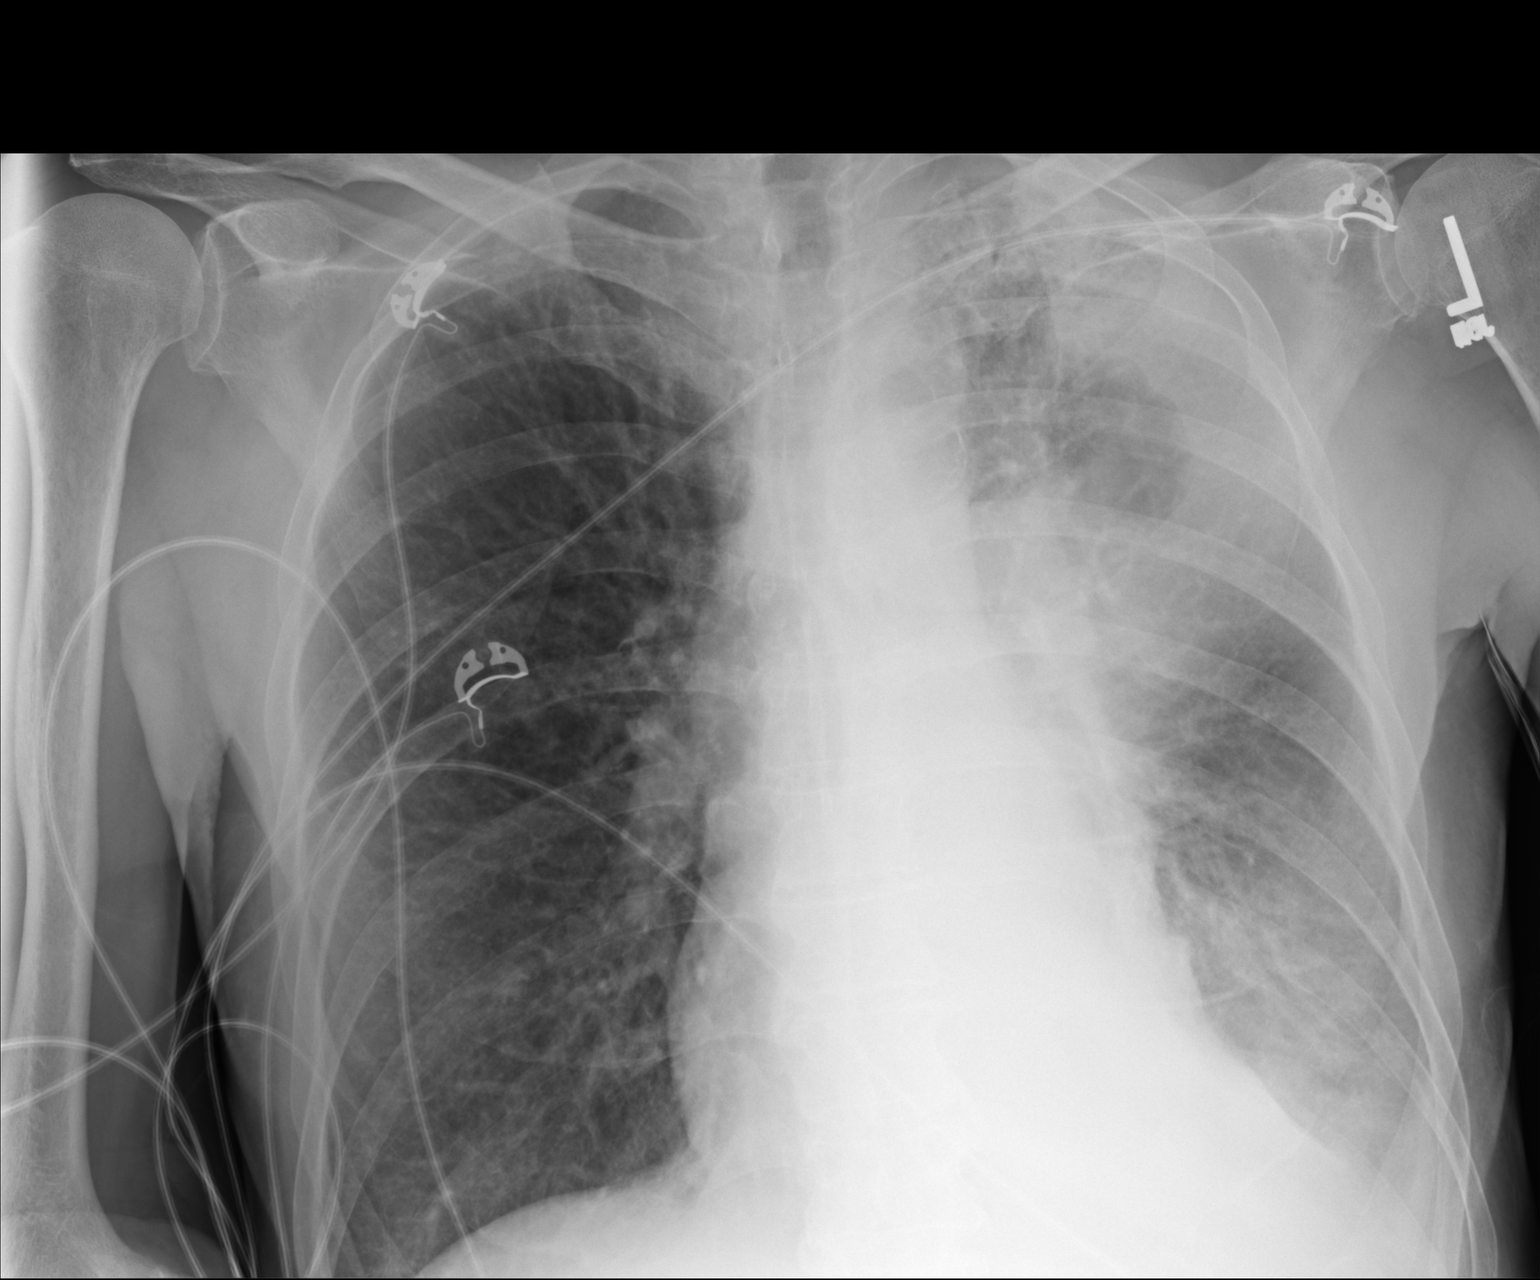

[AP (2 of 2)]
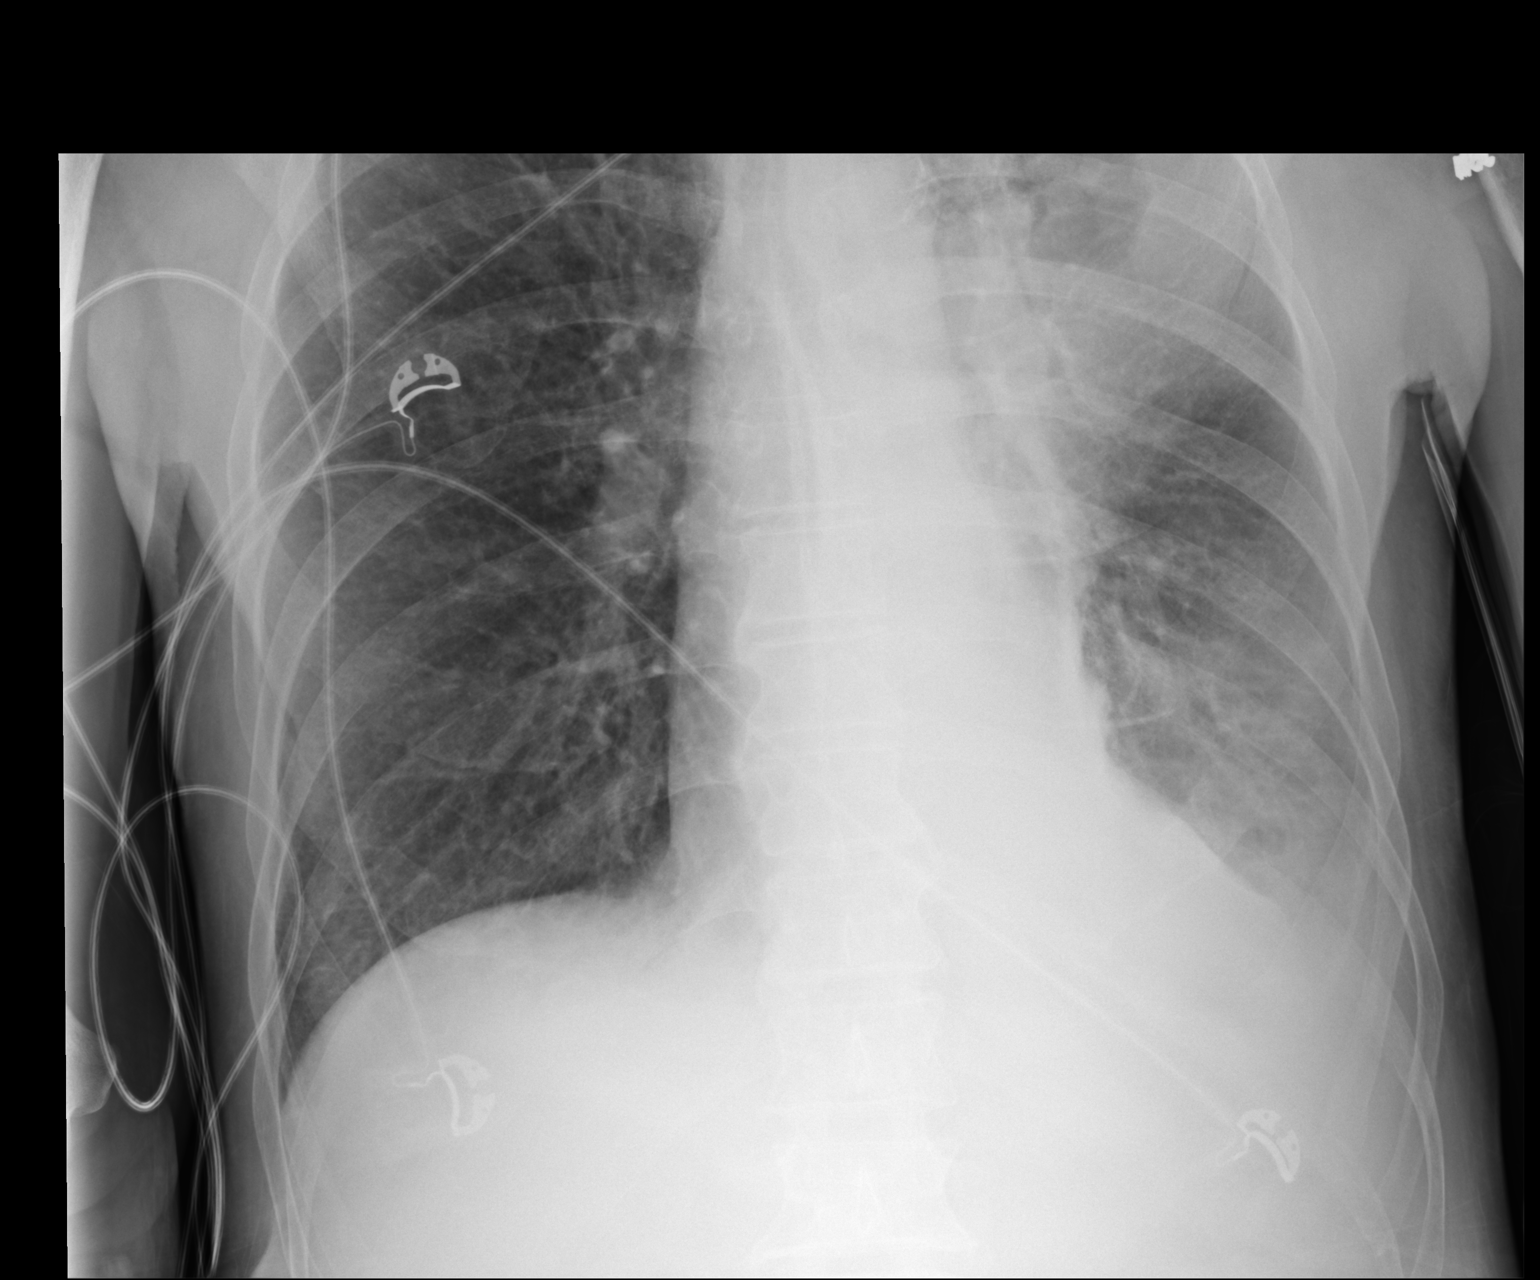

[2 of 2 positions shown; findings below may reference images not displayed]

FINDINGS: Left upper lung mass or consolidation with central cavitation. This
could be due to neoplasm or necrotizing infection. TB or fungal
infection not excluded. There is progression of infiltration now all
seen in the left lower lung. Probable atelectasis in the left lung
base behind the heart. Small left pleural effusion. Right lung is
clear. Normal heart size and pulmonary vascularity.
IMPRESSION: Left upper lung mass or consolidation with central cavitation.
Increasing infiltration now present in the left lung base. Small
left pleural effusion.
